# Patient Record
Sex: Female | Born: 1980 | Hispanic: Yes | Smoking: Never smoker
Health system: Southern US, Community
[De-identification: ages and names within clinical notes are randomized; demographics above are authoritative.]

## PROBLEM LIST (undated history)

## (undated) ENCOUNTER — Inpatient Hospital Stay (HOSPITAL_COMMUNITY): Payer: Self-pay

## (undated) DIAGNOSIS — Z8742 Personal history of other diseases of the female genital tract: Secondary | ICD-10-CM

## (undated) DIAGNOSIS — F32A Depression, unspecified: Secondary | ICD-10-CM

## (undated) DIAGNOSIS — G43909 Migraine, unspecified, not intractable, without status migrainosus: Secondary | ICD-10-CM

## (undated) DIAGNOSIS — F329 Major depressive disorder, single episode, unspecified: Secondary | ICD-10-CM

## (undated) DIAGNOSIS — N888 Other specified noninflammatory disorders of cervix uteri: Secondary | ICD-10-CM

## (undated) HISTORY — PX: CYST EXCISION: SHX5701

---

## 2007-07-19 HISTORY — PX: OVARIAN CYST REMOVAL: SHX89

## 2011-07-19 NOTE — L&D Delivery Note (Signed)
Delivery Note At 9:23 AM a viable female was delivered via Vaginal, Spontaneous Delivery (Presentation: Middle Occiput Anterior).  APGAR: 5, 8; weight 9 lb 7 oz (4280 g).   Placenta status: Intact, Spontaneous.  Cord: 3 vessels. Cord pH: not drawn Baby was delivered through a tight nuchal cord, with tight shoulders released with McRobert's. Of note, pt had brisk bleeding following delivery of placenta with good hemostasis after fundal massage, initiation of Pitocin IV, and Cytotec 800 mcg PR. Pt had a complex sulcus/labial and 2nd degree perineal tear, repaired by Ivonne Andrew, CNM assisted by Dr. Casper Harrison.  Anesthesia: Epidural  Episiotomy: None Lacerations: Sulcus;2nd degree;Perineal Suture Repair: 3.0 vicryl rapide and 3.0 and 4.0 Monocryl Est. Blood Loss (mL): 650  Mom to postpartum.  Baby to nursery-stable.  Ivonne Andrew, CNM was present for and assisted with the delivery in its entirety.  Nova Evett 07/05/2012, 11:30 AM

## 2011-07-19 NOTE — L&D Delivery Note (Signed)
I was present for the delivery and agree with above. Shoulders were OT, snug. Ivonne Andrew, CNM stepped in after Dr. Casper Harrison unable to delivery baby w/ gentle downward traction on head. Delivery achieved by hooking right axilla. Baby delivered by somersaulted maneuver due to tight nuchal.   Dorathy Kinsman, CNM 07/05/2012 1:10 PM

## 2011-10-31 ENCOUNTER — Inpatient Hospital Stay (HOSPITAL_COMMUNITY)
Admission: AD | Admit: 2011-10-31 | Discharge: 2011-10-31 | Disposition: A | Discharge status: Home / Self Care | Payer: Self-pay | Source: Ambulatory Visit | Attending: Obstetrics and Gynecology | Admitting: Obstetrics and Gynecology

## 2011-10-31 ENCOUNTER — Inpatient Hospital Stay (HOSPITAL_COMMUNITY): Payer: Self-pay

## 2011-10-31 ENCOUNTER — Encounter (HOSPITAL_COMMUNITY): Payer: Self-pay | Admitting: *Deleted

## 2011-10-31 DIAGNOSIS — O26899 Other specified pregnancy related conditions, unspecified trimester: Secondary | ICD-10-CM

## 2011-10-31 DIAGNOSIS — O3410 Maternal care for benign tumor of corpus uteri, unspecified trimester: Secondary | ICD-10-CM | POA: Insufficient documentation

## 2011-10-31 DIAGNOSIS — N83209 Unspecified ovarian cyst, unspecified side: Secondary | ICD-10-CM

## 2011-10-31 DIAGNOSIS — R109 Unspecified abdominal pain: Secondary | ICD-10-CM

## 2011-10-31 DIAGNOSIS — D259 Leiomyoma of uterus, unspecified: Secondary | ICD-10-CM | POA: Insufficient documentation

## 2011-10-31 DIAGNOSIS — O34599 Maternal care for other abnormalities of gravid uterus, unspecified trimester: Secondary | ICD-10-CM | POA: Insufficient documentation

## 2011-10-31 DIAGNOSIS — D219 Benign neoplasm of connective and other soft tissue, unspecified: Secondary | ICD-10-CM

## 2011-10-31 DIAGNOSIS — R1032 Left lower quadrant pain: Secondary | ICD-10-CM | POA: Insufficient documentation

## 2011-10-31 DIAGNOSIS — N831 Corpus luteum cyst of ovary, unspecified side: Secondary | ICD-10-CM | POA: Insufficient documentation

## 2011-10-31 HISTORY — DX: Major depressive disorder, single episode, unspecified: F32.9

## 2011-10-31 HISTORY — DX: Depression, unspecified: F32.A

## 2011-10-31 LAB — URINALYSIS, ROUTINE W REFLEX MICROSCOPIC
Bilirubin Urine: NEGATIVE
Glucose, UA: 100 mg/dL — AB
Hgb urine dipstick: NEGATIVE
Specific Gravity, Urine: 1.025 (ref 1.005–1.030)
Urobilinogen, UA: 0.2 mg/dL (ref 0.0–1.0)
pH: 6 (ref 5.0–8.0)

## 2011-10-31 LAB — CBC
HCT: 37.3 % (ref 36.0–46.0)
MCH: 31.6 pg (ref 26.0–34.0)
MCHC: 33.8 g/dL (ref 30.0–36.0)
MCV: 93.5 fL (ref 78.0–100.0)
Platelets: 220 10*3/uL (ref 150–400)
RDW: 13.7 % (ref 11.5–15.5)

## 2011-10-31 LAB — DIFFERENTIAL
Basophils Absolute: 0 10*3/uL (ref 0.0–0.1)
Eosinophils Absolute: 0.1 10*3/uL (ref 0.0–0.7)
Eosinophils Relative: 2 % (ref 0–5)
Lymphocytes Relative: 37 % (ref 12–46)
Monocytes Absolute: 0.3 10*3/uL (ref 0.1–1.0)

## 2011-10-31 LAB — URINE MICROSCOPIC-ADD ON

## 2011-10-31 LAB — HCG, QUANTITATIVE, PREGNANCY: hCG, Beta Chain, Quant, S: 13846 m[IU]/mL — ABNORMAL HIGH (ref <5)

## 2011-10-31 LAB — WET PREP, GENITAL
Trich, Wet Prep: NONE SEEN
Yeast Wet Prep HPF POC: NONE SEEN

## 2011-10-31 MED ORDER — IBUPROFEN 400 MG PO TABS
400.0000 mg | ORAL_TABLET | Freq: Once | ORAL | Status: AC
  Administered 2011-10-31: 400 mg via ORAL
  Filled 2011-10-31: qty 1

## 2011-10-31 NOTE — MAU Note (Signed)
Pt presents for abdominal pain x4days.  Denies any bleeding or vaginal discharge.  Pt states having positive pregnancy test at medical clinic in [facility].

## 2011-10-31 NOTE — Discharge Instructions (Signed)
Dolor abdominal en el embarazo  (Abdominal Pain During Pregnancy) Las molestias abdominales son frecuentes Academic librarian. Generalmente no causan ningn dao. Puede tener numerosas causas. Algunas causas son ms graves que otras. Ciertas causas se diagnostican fcilmente. En algunos casos, se demora algn tiempo para comprender el diagnstico. Otras veces la causa no se conoce. El dolor abdominal puede ser un signo de que algo no anda bien en el [place] puede ser debido a una causa totalmente diferente. Por este motivo, siempre comente a su mdico cuando sienta molestias abdominales.  CAUSAS  Las causas ms frecuentes y que no causan ningn dao son:   Constipacin.   Exceso de gases y meteorismo.   Dolor en el ligamento redondo. Este dolor se siente en los pliegues de la ingle.   La posicin en que se encuentra el beb o la placenta.   Las pataditas del beb.   Contracciones de Braxton-Hicks. Estas son contracciones suaves que no producen dilatacin del cuello.  Otras causas graves de dolor abdominal son:   Vanetta Mulders ectpico Se produce cuando un vulo fertilizado se implanta fuera del tero.   Aborto espontneo.   Parto prematuro. El parto prematuro comienza antes de la semana 37 de [place].   Desprendimiento de la placenta. Ocurre cuando la placenta se separa parcial o completamente del tero.   Preeclampsia Generalmente se asocia a hipertensin arterial y tambin se denomina "toxemia del embarazo".   Infecciones del tero o del lquido amnitico.  Las causas que no se relacionan con Firefighter son:   Infeccin del tracto urinario.   Clculos o inflamacin de la vescula.   Hepatitis u otras enfermedades del hgado.   Trastornos intestinales, virus en el estmago, intoxicacin alimentaria, lcera.   Apendicitis.   Clculos en el rin (renales).   Infeccin renal (pielonefritis).  INSTRUCCIONES PARA EL CUIDADO EN EL HOGAR  Si el dolor es leve:   No tenga  relaciones sexuales y no coloque nada dentro de la vagina hasta que los sntomas hayan desaparecido completamente.   Descanse todo lo que pueda hasta que el dolor haya calmado. Si el dolor no mejora en una hora, comunquese con su mdico.   Si siente nuseas, beba lquidos claros. Evite los alimentos slidos hasta que no sienta Dentist en el estmago o desaparezcan las nuseas.   Tome slo la medicacin que le indic el profesional.   Concurra puntualmente a las citas con el mdico.  SOLICITE ATENCIN MDICA DE INMEDIATO SI:   Tiene un sangrado, prdida de lquidos o lo observa al limpiarse la vagina con tis.   El dolor o los clicos [place].   Tiene vmitos persistentes.   Comienza a Financial risk analyst al orinar u [employer].   Tiene fiebre.   Los movimientos del beb disminuyen.   Nota un debilitamiento extremo o se marea.   Tiene dificultad para respirar con o sin dolor abdominal.   Siente un dolor de cabeza intenso junto al dolor abdominal.   Tiene secrecin vaginal con dolor abdominal.   Tiene diarrea persistente.   El dolor abdominal sigue an despus de Field seismologist.  ASEGRESE DE QUE:   Comprende estas instrucciones.   Controlar su enfermedad.   Solicitar ayuda de inmediato si no mejora o si empeora.  Document Released: 07/04/2005 Document Revised: 06/23/2011 [facility] Patient Information 2012 [place], [place].Fibromas (Fibroids) Los fibromas son bultos (tumores) que pueden Conservation officer, nature del cuerpo de Nurse, mental health. Estos tumores no son cancerosos. Pueden variar en Derrill Kay y  lugar en el que crecen. CUIDADOS EN EL HOGAR  No tome aspirina.   Anote el nmero de apsitos o tampones que [place] durante el perodo. Infrmelo a su mdico. Esto puede ayudar a determinar el mejor tratamiento para usted.  SOLICITE AYUDA DE INMEDIATO SI:  Siente dolor en la zona inferior del vientre (abdomen) y no se alivia con analgsicos.   Tiene clicos que no se  calman con medicamentos   Aumenta el sangrado entre perodos o durante el mismo.   Sufre mareos o se desvanece (se desmaya).   El dolor en el vientre [place].  ASEGRESE DE QUE:  Comprende estas instrucciones.   Controlar su enfermedad.   Solicitar ayuda de inmediato si no mejora o empeora.  Document Released: 10/19/2010 Document Revised: 06/23/2011 [facility] Patient Information 2012 [place], [place].Quiste ovrico (Ovarian Cyst) Los ovarios son pequeos rganos que se encuentran a cada lado del tero. Los ovarios son los rganos que producen las hormonas femeninas, estrgeno y Education officer, museum. Un quiste en el ovario es una bolsa llena de lquido que puede variar en tamao. Es normal que se formen pequeos quistes en las mujeres en edad de procrear y que an tienen sus perodos Designer, jewellery. Este tipo de quiste se denomina quiste folicular que se transforma en un quiste ovulatorio (quiste del cuerpo lteo) despus de producir los vulos. Si la mujer no queda embarazada, desaparece sin ninguna intervencin. Existen otros tipos de quistes de ovario que pueden causar problemas y necesitan ser tratados. El problema ms grave es que el quiste sea canceroso. Debe advertirse que en las mujeres menopusicas que presentan un quiste de ovario, existe un mayor riesgo de que ese quiste sea canceroso. Deben evaluarse muy rpida y [place], y IT sales professional. Esto es ms importante en las mujeres menopusicas debido al elevado porcentaje de cncer de ovario durante este perodo. CAUSAS Y TIPOS DE CNCER DE OVARIO:  QUISTE FUNCIONAL: El quiste de folculo o cuerpo lteo es un quiste funcional que aparece todos los meses durante la ovulacin, con el ciclo menstrual. Si la mujer no queda embarazada, desaparecen con el prximo ciclo menstrual. Generalmente los quistes funcionales no presentan sntomas.   ENDOMETRIOMA: este quiste aparece en la superficie del tejido del tero. Un quiste se forma en el  interior o [employer]. Cada mes se desarrolla un poco ms debido a la sangre del perodo menstrual. Tambin se denomina "quiste de chocolate" debido a que est lleno de sangre que se vuelve color marrn. Este tipo de quiste causa dolor en la zona inferior del abdomen durante las relaciones sexuales y durante el perodo menstrual.   CISTADENOMA: Se desarrolla a partir de las clulas externas del ovario. Generalmente no son cancerosos. Pueden llegar a ser de gran tamao y causar dolor en la zona baja del abdomen y [employer] sexuales. Este tipo de quiste puede retorcerse e interrumpir el flujo de [place], lo que causa un dolor muy intenso. Tambin puede romperse y Horticulturist, commercial.   QUISTE DERMOIDE: generalmente este tipo de quiste aparece en ambos ovarios. Puede haber diferentes tipos de tejidos en el quiste. Por ejemplo tejidos de piel, dientes, pelos o cartlago. En general no dan sntomas, excepto que sean muy grandes. Los quistes dermoides rara vez son cancerosos.   OVARIO POLIQUSTICO: es una enfermedad rara relacionada con trastornos hormonales que produce muchos quistes pequeos en ambos ovarios. Estos quistes son similares a los quistes de folculo pero nunca producen vulos y se transforman en cuerpo lteo. Pueden causar aumento del  peso corporal, infertilidad, acn, aumento del vello facial y corporal y falta de perodos menstruales o perodos anormales. Muchas mujeres que sufren este problema presentan diabetes tipo 2. La causa exacta de este problema es desconocida. Un ovario poliqustico rara vez es canceroso.   QUISTE OVRICO TECALUTESTICO Aparece cuando hay demasiada hormona (gonadotrofina corinica humana), la que sobreestimula al ovario para producir vulos. Se observan con frecuencia cuando el mdico estimula los ovarios para la fertilizacin in vitro (bebs de probeta).   QUISTE LUTENICO: Aparece durante el embarazo. En algunos casos raros, produce una obstruccin del  canal de parto. Generalmente desaparece despus del parto.  SNTOMAS  Dolor o molestias en la pelvis.   Dolor durante las [employer].   Aumento de la inflamacin en el abdomen.   Perodos menstruales anormales.   Aumento del [employer] perodos [place].   Deja de menstruar y no est embarazada.  DIAGNSTICO El diagnstico puede realizarse durante:  Los exmenes plvicos anuales o de rutina (frecuente).   Ecografas   Radiografas de la pelvis.   Tomografa computada   Resonancia magntica..   Anlisis de sangre.  TRATAMIENTO  El tratamiento slo consiste en que el mdico controle el quiste [place], durante 2  3 meses. Muchos desaparecen espontnemente, especialmente los quistes funcionales.   Puede aspirarse (secarse) con Marella Bile larga observndolo en una ecografa, o por laparoscopa (insertando un tubo en la pelvis a travs de una pequea incisin).   El quiste puede extirparse con laparoscopa.   En algunos casos es necesario extirparlo a travs de una incisin en la zona inferior del abdomen.   El tratamiento hormonal se utiliza para Restaurant manager, fast food ciertos tipos de [place].   Las pldoras anticonceptivas pueden utilizarse para Restaurant manager, fast food otros tipos.  INSTRUCCIONES PARA EL CUIDADO DOMICILIARIO Siga las indicaciones del profesional con respecto a:  Medicamentos   Visitas de control para evaluar y Pharmacologist.   Puede ser necesario que tenga que volver o concertar una cita con otro profesional para descubrir la causa exacta del quiste, si su mdico no es Research scientist (physical sciences).   Realice un examen plvico y un Papanicolau todos los aos, segn las indicaciones.   Informe al mdico si tubo un quiste de ovario en el pasado.  SOLICITE ATENCIN MDICA SI:  Los perodos se atrasan, son irregulares, le faltan o son dolorosos.   El dolor abdominal (en el vientre) o en la pelvis persisten.   El abdomen se agranda o se hincha.   Siente una opresin en la  vejiga o tiene problemas para vaciarla completamente.   Tiene dolor durante las [employer].   Tiene la sensacin de hinchazn, presin o molestias en el abdomen.   Pierde peso sin razn aparente.   Siente un Engineer, maintenance (IT).   Est constipada.   Pierde el apetito.   Aparece acn.   Aumenta el vello facial y Personal assistant.   Lenora Boys de peso sin hacer modificaciones en su actividad fsica y en su dieta habitual.   Sospecha que est embarazada.  SOLICITE ATENCIN MDICA DE INMEDIATO SI:  Siente dolor abdominal cada vez ms intenso.   Si tiene ganas de vomitar (nuseas).   Le sube repentinamente la fiebre.   Siente dolor abdominal al mover el intestino.   Sus perodos menstruales son ms abundantes que lo habitual.  Document Released: 04/13/2005 Document Revised: 06/23/2011 [facility] Patient Information 2012 [place], [place].

## 2011-10-31 NOTE — Progress Notes (Signed)
[facility] Department of Clinical Social Work Documentation of Interpretation   I assisted __Brandy & Judy RN'S_________________ with interpretation of __questions____________________ for this patient.

## 2011-10-31 NOTE — MAU Provider Note (Signed)
Agree with above note.  Kendal Raffo 10/31/2011 1:34 PM   

## 2011-10-31 NOTE — MAU Provider Note (Signed)
History     CSN: [id number]  Arrival date & time 10/31/11  0740   None     Chief Complaint  Patient presents with  . Abdominal Pain    HPI Caitlin Bailey is a 31 y.o. female @ [redacted]w[redacted]d gestation who presents to MAU for abdominal pain. The pain started 4 days ago and has gotten worse. She describes the pain as sharp and comes and goes. The pain starts in the LLQ of the abdomen and radiates to the left side of her back. She rates the pain as 8/10. The history was provided by the patient using Eda Royal the [place] translator.  Past Medical History  Diagnosis Date  . Depression     took meds after mother's passing    Past Surgical History  Procedure Date  . Ovarian cyst removal 2009    Family History  Problem Relation Age of Onset  . Heart disease Mother   . Diabetes Father   . Cancer Paternal Aunt     History  Substance Use Topics  . Smoking status: Never Smoker   . Smokeless tobacco: Not on file  . Alcohol Use: No    OB History    Grav Para Term Preterm Abortions TAB SAB Ect Mult Living   1               Review of Systems  Constitutional: Negative for fever, chills, diaphoresis and fatigue.  HENT: Negative for ear pain, congestion, sore throat, facial swelling, neck pain, neck stiffness, dental problem and sinus pressure.   Eyes: Negative for photophobia, pain and discharge.  Respiratory: Negative for cough, chest tightness and wheezing.   Gastrointestinal: Positive for nausea and abdominal pain. Negative for vomiting, diarrhea, constipation and abdominal distention.  Genitourinary: Negative for dysuria, frequency, flank pain, vaginal bleeding, vaginal discharge, difficulty urinating and vaginal pain.  Musculoskeletal: Positive for back pain. Negative for myalgias and gait problem.  Skin: Negative for color change and rash.  Neurological: Negative for dizziness, speech difficulty, weakness, light-headedness, numbness and headaches.  Psychiatric/Behavioral:  Negative for confusion and agitation. The patient is not nervous/anxious.     Allergies  Review of patient's allergies indicates no known allergies.  Home Medications  No current outpatient prescriptions on file.  BP 110/64  Pulse 79  Temp(Src) 98.8 F (37.1 C) (Oral)  Resp 18  Ht 5\' 1"  (1.549 m)  Wt 120 lb 12.8 oz (54.795 kg)  BMI 22.83 kg/m2  LMP 09/18/2011  Physical Exam  Nursing note and vitals reviewed. Constitutional: She is oriented to person, place, and time. She appears well-developed and well-nourished. No distress.  HENT:  Head: Normocephalic.  Eyes: EOM are normal.  Neck: Neck supple.  Cardiovascular: Normal rate.   Pulmonary/Chest: Effort normal.  Abdominal: Soft. There is no tenderness.  Genitourinary:       External genitalia without lesions. Mucous discharge vaginal vault. Cervix closed, no CMT, mild left adnexal tenderness. Uterus slightly enlarged.  Musculoskeletal: Normal range of motion.  Neurological: She is alert and oriented to person, place, and time. No cranial nerve deficit.  Skin: Skin is warm and dry.  Psychiatric: She has a normal mood and affect. Her behavior is normal. Judgment and thought content normal.   Results for orders placed during the hospital encounter of 10/31/11 (from the past 24 hour(s))  POCT PREGNANCY, URINE     Status: Abnormal   Collection Time   10/31/11  7:56 AM      Component Value Range  Preg Test, Ur POSITIVE (*) NEGATIVE   URINALYSIS, ROUTINE W REFLEX MICROSCOPIC     Status: Abnormal   Collection Time   10/31/11  8:25 AM      Component Value Range   Color, Urine YELLOW  YELLOW    APPearance CLEAR  CLEAR    Specific Gravity, Urine 1.025  1.005 - 1.030    pH 6.0  5.0 - 8.0    Glucose, UA 100 (*) NEGATIVE (mg/dL)   Hgb urine dipstick NEGATIVE  NEGATIVE    Bilirubin Urine NEGATIVE  NEGATIVE    Ketones, ur NEGATIVE  NEGATIVE (mg/dL)   Protein, ur NEGATIVE  NEGATIVE (mg/dL)   Urobilinogen, UA 0.2  0.0 - 1.0 (mg/dL)     Nitrite NEGATIVE  NEGATIVE    Leukocytes, UA SMALL (*) NEGATIVE   URINE MICROSCOPIC-ADD ON     Status: Normal   Collection Time   10/31/11  8:25 AM      Component Value Range   Squamous Epithelial / LPF RARE  RARE    WBC, UA 0-2  <3 (WBC/hpf)  CBC     Status: Normal   Collection Time   10/31/11  9:29 AM      Component Value Range   WBC 6.1  4.0 - 10.5 (K/uL)   RBC 3.99  3.87 - 5.11 (MIL/uL)   Hemoglobin 12.6  12.0 - 15.0 (g/dL)   HCT 57.8  46.9 - 62.9 (%)   MCV 93.5  78.0 - 100.0 (fL)   MCH 31.6  26.0 - 34.0 (pg)   MCHC 33.8  30.0 - 36.0 (g/dL)   RDW 52.8  41.3 - 24.4 (%)   Platelets 220  150 - 400 (K/uL)  DIFFERENTIAL     Status: Normal   Collection Time   10/31/11  9:29 AM      Component Value Range   Neutrophils Relative 55  43 - 77 (%)   Neutro Abs 3.3  1.7 - 7.7 (K/uL)   Lymphocytes Relative 37  12 - 46 (%)   Lymphs Abs 2.2  0.7 - 4.0 (K/uL)   Monocytes Relative 6  3 - 12 (%)   Monocytes Absolute 0.3  0.1 - 1.0 (K/uL)   Eosinophils Relative 2  0 - 5 (%)   Eosinophils Absolute 0.1  0.0 - 0.7 (K/uL)   Basophils Relative 1  0 - 1 (%)   Basophils Absolute 0.0  0.0 - 0.1 (K/uL)  HCG, QUANTITATIVE, PREGNANCY     Status: Abnormal   Collection Time   10/31/11  9:29 AM      Component Value Range   hCG, Beta Chain, Quant, S 13846 (*) <5 (mIU/mL)  ABO/RH     Status: Normal   Collection Time   10/31/11  9:29 AM      Component Value Range   ABO/RH(D) O POS     Ultrasound shows a 5.5 week IUGS but no YS or FP with 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  ED Course  Procedures   MDM

## 2011-11-01 LAB — GC/CHLAMYDIA PROBE AMP, GENITAL
Chlamydia, DNA Probe: NEGATIVE
GC Probe Amp, Genital: NEGATIVE

## 2011-11-10 ENCOUNTER — Ambulatory Visit (HOSPITAL_COMMUNITY)
Admit: 2011-11-10 | Discharge: 2011-11-10 | Disposition: A | Discharge status: Home / Self Care | Payer: Self-pay | Attending: Obstetrics and Gynecology | Admitting: Obstetrics and Gynecology

## 2011-11-10 ENCOUNTER — Encounter (HOSPITAL_COMMUNITY): Payer: Self-pay | Admitting: *Deleted

## 2011-11-10 ENCOUNTER — Inpatient Hospital Stay (HOSPITAL_COMMUNITY)
Admission: AD | Admit: 2011-11-10 | Discharge: 2011-11-10 | Disposition: A | Discharge status: Home / Self Care | Payer: Self-pay | Source: Ambulatory Visit | Attending: Obstetrics & Gynecology | Admitting: Obstetrics & Gynecology

## 2011-11-10 DIAGNOSIS — O210 Mild hyperemesis gravidarum: Secondary | ICD-10-CM | POA: Insufficient documentation

## 2011-11-10 DIAGNOSIS — R1032 Left lower quadrant pain: Secondary | ICD-10-CM | POA: Insufficient documentation

## 2011-11-10 DIAGNOSIS — R5381 Other malaise: Secondary | ICD-10-CM | POA: Insufficient documentation

## 2011-11-10 DIAGNOSIS — R5383 Other fatigue: Secondary | ICD-10-CM | POA: Insufficient documentation

## 2011-11-10 DIAGNOSIS — O99891 Other specified diseases and conditions complicating pregnancy: Secondary | ICD-10-CM | POA: Insufficient documentation

## 2011-11-10 DIAGNOSIS — Z09 Encounter for follow-up examination after completed treatment for conditions other than malignant neoplasm: Secondary | ICD-10-CM

## 2011-11-10 DIAGNOSIS — R51 Headache: Secondary | ICD-10-CM | POA: Insufficient documentation

## 2011-11-10 DIAGNOSIS — O3680X0 Pregnancy with inconclusive fetal viability, not applicable or unspecified: Secondary | ICD-10-CM | POA: Insufficient documentation

## 2011-11-10 NOTE — MAU Provider Note (Signed)
Attestation of Attending Supervision of Advanced Practitioner: Evaluation and management procedures were performed by the OB Fellow/PA/CNM/NP under my supervision and collaboration. Chart reviewed, and agree with management and plan.  Bruin Bolger, M.D. 11/10/2011 2:39 PM   

## 2011-11-10 NOTE — MAU Note (Signed)
Pt states per [facility] interpreter she feels nauseated, weak, feels different. Hx migraines, is concerned that her headaches will get worse. Denies bleeding or abnormal vaginal discharge. Still has same suprapubic pain x3 weeks, has not worsened.

## 2011-11-10 NOTE — Discharge Instructions (Signed)
    ________________________________________     To schedule your Maternity Eligibility Appointment, please call [phone].  When you arrive for your appointment you must bring the following items or information listed below.  Your appointment will be rescheduled if you do not have these items or are 15 minutes late. If currently receiving Medicaid, you MUST bring: 1. Medicaid Card 2. Social Security Card 3. Picture ID 4. Proof of Pregnancy 5. Verification of current address if the address on Medicaid card is incorrect "postmarked mail" If not receiving Medicaid, you MUST bring: 1. Social Security Card 2. Picture ID 3. Birth Certificate (if available) Passport or *Green Card 4. Proof of Pregnancy 5. Verification of current address "postmarked mail" for each income presented. 6. Verification of insurance coverage, if any 7. Check stubs from each employer for the previous month (if unable to present check stub  for each week, we will accept check stub for the first and last week ill the same month.) If you can't locate check stubs, you must bring a letter from the employer(s) and it must have the following information on letterhead, typed, in English: o name of company o company telephone number o how long been with the company, if less than one month o how much person earns per hour o how many hours per week work o the gross pay the person earned for the previous month If you are 29 years old or less, you do not have to bring proof of income unless you work or live with the father of the baby and at that time we will need proof of income from you and/or the father of the baby. Green Card recipients are eligible for Medicaid for Pregnant Women (MPW)    

## 2011-11-10 NOTE — MAU Provider Note (Signed)
History     CSN: [id number]  Arrival date & time 11/10/11  1105   None     No chief complaint on file.   HPI Caitlin Bailey is a 31 y.o. female who returns to MAU for follow up ultrasound. She denies any problems today.  Past Medical History  Diagnosis Date  . Depression     took meds after mother's passing    Past Surgical History  Procedure Date  . Ovarian cyst removal 2009    Family History  Problem Relation Age of Onset  . Heart disease Mother   . Diabetes Father   . Cancer Paternal Aunt     History  Substance Use Topics  . Smoking status: Never Smoker   . Smokeless tobacco: Not on file  . Alcohol Use: No    OB History    Grav Para Term Preterm Abortions TAB SAB Ect Mult Living   1 0 0 0 0 0 0 0 0 0       Review of Systems  Gastrointestinal: Abdominal pain: occasional cramping.  Genitourinary: Positive for frequency.  All other systems reviewed and are negative.    Allergies  Review of patient's allergies indicates no known allergies.  Home Medications  No current outpatient prescriptions on file.  BP 109/58  Pulse 72  Temp(Src) 98.2 F (36.8 C) (Oral)  Resp 16  Ht 5\' 1"  (1.549 m)  Wt 118 lb 6 oz (53.695 kg)  BMI 22.37 kg/m2  LMP 09/18/2011  Physical Exam  Nursing note and vitals reviewed. Constitutional: She appears well-developed and well-nourished. No distress.  HENT:  Head: Normocephalic.  Cardiovascular: Normal rate.   Pulmonary/Chest: Effort normal.  Psychiatric: She has a normal mood and affect. Her behavior is normal. Judgment and thought content normal.   Instructions and results given to patient using the hospital Spanish translator Estate manager/land agent).  Assessment: Follow up   Viable IUP at 6 weeks and 1 day  Plan:  Start prenatal care   Pregnancy verification letter given.   Return as needed.    I have reviewed this patient's vital signs, nurses notes, appropriate labs and imaging.      ED Course  Procedures       MDM

## 2012-01-07 ENCOUNTER — Encounter (HOSPITAL_COMMUNITY): Payer: Self-pay

## 2012-01-07 ENCOUNTER — Inpatient Hospital Stay (HOSPITAL_COMMUNITY)
Admission: AD | Admit: 2012-01-07 | Discharge: 2012-01-07 | Disposition: A | Discharge status: Home / Self Care | Payer: Self-pay | Source: Ambulatory Visit | Attending: Family Medicine | Admitting: Family Medicine

## 2012-01-07 DIAGNOSIS — D259 Leiomyoma of uterus, unspecified: Secondary | ICD-10-CM

## 2012-01-07 DIAGNOSIS — O3410 Maternal care for benign tumor of corpus uteri, unspecified trimester: Secondary | ICD-10-CM | POA: Insufficient documentation

## 2012-01-07 DIAGNOSIS — O210 Mild hyperemesis gravidarum: Secondary | ICD-10-CM | POA: Insufficient documentation

## 2012-01-07 DIAGNOSIS — R109 Unspecified abdominal pain: Secondary | ICD-10-CM | POA: Insufficient documentation

## 2012-01-07 LAB — CBC
Hemoglobin: 12.3 g/dL (ref 12.0–15.0)
MCH: 31.4 pg (ref 26.0–34.0)
MCHC: 34.1 g/dL (ref 30.0–36.0)
MCV: 92.1 fL (ref 78.0–100.0)
Platelets: 236 10*3/uL (ref 150–400)
RBC: 3.92 MIL/uL (ref 3.87–5.11)

## 2012-01-07 MED ORDER — IBUPROFEN 600 MG PO TABS
600.0000 mg | ORAL_TABLET | Freq: Once | ORAL | Status: AC
  Administered 2012-01-07: 600 mg via ORAL
  Filled 2012-01-07: qty 1

## 2012-01-07 MED ORDER — IBUPROFEN 600 MG PO TABS: 600.0000 mg | ORAL_TABLET | Freq: Four times a day (QID) | ORAL | Status: AC | PRN

## 2012-01-07 MED ORDER — PROMETHAZINE HCL 25 MG PO TABS: 25.0000 mg | ORAL_TABLET | Freq: Four times a day (QID) | ORAL | Status: DC | PRN

## 2012-01-07 NOTE — MAU Provider Note (Signed)
Chart reviewed and agree with management and plan.  

## 2012-01-07 NOTE — MAU Note (Signed)
Onset of mid abdominal pain onset around 5:00 no N/V/D, having some constipation, LBM today normal, no vaginal bleeding, no vaginal discharge.

## 2012-01-07 NOTE — Discharge Instructions (Signed)
Rx ibuprofen 600 gm PO q 6 hours x 48 hours only - do not take any ibuprofen after 48 hours,. Take Tylenol 325 mg 2 tablets by mouth every 4 hours if needed for pain. Begin prenatal care. Return if severe pain or severe bleeding. Rx phenergan 25 mg one po q 6 hours as needed for nausea.  Sedation precautions. Drink at least 8 8-oz glasses of water every day.

## 2012-01-07 NOTE — MAU Provider Note (Signed)
History     CSN: [id number]  Arrival date and time: 01/07/12 4098   First Provider Initiated Contact with Patient 01/07/12 [phone]      Chief Complaint  Patient presents with  . Abdominal Pain   HPI Caitlin Bailey 31 y.o. [redacted]w[redacted]d Comes to MAU with abdominal pain above and below the umbilicus.  Language line used for client interview.  Has not yet started prenatal care.  Has been seen in MAU previously.  Has known fibroids on ultrasound.  Has had increased abdominal pain since Thursday.  Takes 1/2 of an extra strength Tylenol for pain.  Has had some vomiting.  Last vomited this morning.  No vaginal bleeding.  No dysuria.    OB History    Grav Para Term Preterm Abortions TAB SAB Ect Mult Living   1 0 0 0 0 0 0 0 0 0       Past Medical History  Diagnosis Date  . Depression     took meds after mother's passing    Past Surgical History  Procedure Date  . Ovarian cyst removal 2009    Family History  Problem Relation Age of Onset  . Heart disease Mother   . Diabetes Father   . Cancer Paternal Aunt     History  Substance Use Topics  . Smoking status: Never Smoker   . Smokeless tobacco: Not on file  . Alcohol Use: No    Allergies: No Known Allergies  Prescriptions prior to admission  Medication Sig Dispense Refill  . acetaminophen (TYLENOL) 500 MG tablet Take 250 mg by mouth daily as needed. For pain      . Prenatal Vit-Fe Fumarate-FA (PRENATAL MULTIVITAMIN) TABS Take 1 tablet by mouth daily.        Review of Systems  Constitutional: Negative for fever.  Gastrointestinal: Positive for nausea, vomiting and abdominal pain.  Genitourinary: Negative for dysuria.       No vaginal discharge. No vaginal bleeding. No dysuria.   Physical Exam   Blood pressure 111/64, pulse 80, temperature 98.6 F (37 C), temperature source Oral, resp. rate 18, height 5\' 1"  (1.549 m), weight 121 lb (54.885 kg), last menstrual period 09/18/2011.  Physical Exam  Nursing note and vitals  reviewed. Constitutional: She is oriented to person, place, and time. She appears well-developed and well-nourished.  HENT:  Head: Normocephalic.  Eyes: EOM are normal.  Neck: Neck supple.  GI: Soft. There is tenderness. There is no rebound and no guarding.       Tender above and below the umbilicus.  Fundus palpated at the umbilicus.  FHT heard at 140 by doppler.  Genitourinary:       Cervix - long closed thick  Musculoskeletal: Normal range of motion.  Neurological: She is alert and oriented to person, place, and time.  Skin: Skin is warm and dry.  Psychiatric: She has a normal mood and affect.    MAU Course  Procedures Reviewed previous notes and ultrasound for client. Fibroids noted on ultrasound done at 6 weeks. Consult with Dr. Shawnie Pons re: plan of care  MDM Results for orders placed during the hospital encounter of 01/07/12 (from the past 24 hour(s))  CBC     Status: Abnormal   Collection Time   01/07/12  9:14 AM      Component Value Range   WBC 11.7 (*) 4.0 - 10.5 K/uL   RBC 3.92  3.87 - 5.11 MIL/uL   Hemoglobin 12.3  12.0 - 15.0 g/dL   HCT  36.1  36.0 - 46.0 %   MCV 92.1  78.0 - 100.0 fL   MCH 31.4  26.0 - 34.0 pg   MCHC 34.1  30.0 - 36.0 g/dL   RDW 08.6  57.8 - 46.9 %   Platelets 236  150 - 400 K/uL    Assessment and Plan  Vomiting in pregnancy Abdominal pain in pregnancy - likely due to fibroids  Plan Rx ibuprofen 600 gm PO q 6 hours x 48 hours only - do not take any ibuprofen after 48 hours,. Take Tylenol 325 mg 2 tablets by mouth every 4 hours if needed for pain. Begin prenatal care. Return if severe pain or severe bleeding. Rx phenergan 25 mg one po q 6 hours as needed for nausea.  Sedation precautions.   Caitlin Bailey 01/07/2012, 9:58 AM

## 2012-02-22 ENCOUNTER — Other Ambulatory Visit (HOSPITAL_COMMUNITY): Payer: Self-pay | Admitting: Physician Assistant

## 2012-02-22 DIAGNOSIS — Z3689 Encounter for other specified antenatal screening: Secondary | ICD-10-CM

## 2012-02-22 DIAGNOSIS — Z0389 Encounter for observation for other suspected diseases and conditions ruled out: Secondary | ICD-10-CM

## 2012-02-22 LAB — OB RESULTS CONSOLE HEPATITIS B SURFACE ANTIGEN: Hepatitis B Surface Ag: NEGATIVE

## 2012-02-22 LAB — OB RESULTS CONSOLE RPR: RPR: NONREACTIVE

## 2012-02-22 LAB — OB RESULTS CONSOLE RUBELLA ANTIBODY, IGM: Rubella: IMMUNE

## 2012-02-27 ENCOUNTER — Ambulatory Visit (HOSPITAL_COMMUNITY)
Admission: RE | Admit: 2012-02-27 | Discharge: 2012-02-27 | Disposition: A | Discharge status: Home / Self Care | Payer: Medicaid Other | Source: Ambulatory Visit | Attending: Physician Assistant | Admitting: Physician Assistant

## 2012-02-27 DIAGNOSIS — O358XX0 Maternal care for other (suspected) fetal abnormality and damage, not applicable or unspecified: Secondary | ICD-10-CM | POA: Insufficient documentation

## 2012-02-27 DIAGNOSIS — Z1389 Encounter for screening for other disorder: Secondary | ICD-10-CM | POA: Insufficient documentation

## 2012-02-27 DIAGNOSIS — Z363 Encounter for antenatal screening for malformations: Secondary | ICD-10-CM | POA: Insufficient documentation

## 2012-02-27 DIAGNOSIS — Z3689 Encounter for other specified antenatal screening: Secondary | ICD-10-CM

## 2012-03-15 ENCOUNTER — Other Ambulatory Visit (HOSPITAL_COMMUNITY): Payer: Self-pay | Admitting: Family

## 2012-03-15 DIAGNOSIS — Z1389 Encounter for screening for other disorder: Secondary | ICD-10-CM

## 2012-03-21 ENCOUNTER — Ambulatory Visit (HOSPITAL_COMMUNITY): Payer: Self-pay

## 2012-04-10 ENCOUNTER — Ambulatory Visit (HOSPITAL_COMMUNITY)
Admission: RE | Admit: 2012-04-10 | Discharge: 2012-04-10 | Disposition: A | Discharge status: Home / Self Care | Payer: Medicaid Other | Source: Ambulatory Visit | Attending: Family | Admitting: Family

## 2012-04-10 DIAGNOSIS — Z3689 Encounter for other specified antenatal screening: Secondary | ICD-10-CM | POA: Insufficient documentation

## 2012-04-10 DIAGNOSIS — Z1389 Encounter for screening for other disorder: Secondary | ICD-10-CM

## 2012-04-10 DIAGNOSIS — O358XX0 Maternal care for other (suspected) fetal abnormality and damage, not applicable or unspecified: Secondary | ICD-10-CM | POA: Insufficient documentation

## 2012-04-26 ENCOUNTER — Other Ambulatory Visit (HOSPITAL_COMMUNITY): Payer: Self-pay | Admitting: Nurse Practitioner

## 2012-04-26 DIAGNOSIS — O358XX0 Maternal care for other (suspected) fetal abnormality and damage, not applicable or unspecified: Secondary | ICD-10-CM

## 2012-05-24 ENCOUNTER — Ambulatory Visit (HOSPITAL_COMMUNITY)
Admission: RE | Admit: 2012-05-24 | Discharge: 2012-05-24 | Disposition: A | Discharge status: Home / Self Care | Payer: Medicaid Other | Source: Ambulatory Visit | Attending: Nurse Practitioner | Admitting: Nurse Practitioner

## 2012-05-24 DIAGNOSIS — O358XX0 Maternal care for other (suspected) fetal abnormality and damage, not applicable or unspecified: Secondary | ICD-10-CM | POA: Insufficient documentation

## 2012-06-08 LAB — OB RESULTS CONSOLE GBS: GBS: NEGATIVE

## 2012-06-18 ENCOUNTER — Inpatient Hospital Stay (HOSPITAL_COMMUNITY)
Admission: AD | Admit: 2012-06-18 | Discharge: 2012-06-18 | Disposition: A | Discharge status: Hospice | Payer: Medicaid Other | Source: Ambulatory Visit | Attending: Obstetrics & Gynecology | Admitting: Obstetrics & Gynecology

## 2012-06-18 ENCOUNTER — Encounter (HOSPITAL_COMMUNITY): Payer: Self-pay | Admitting: *Deleted

## 2012-06-18 DIAGNOSIS — M545 Low back pain, unspecified: Secondary | ICD-10-CM | POA: Insufficient documentation

## 2012-06-18 DIAGNOSIS — R109 Unspecified abdominal pain: Secondary | ICD-10-CM | POA: Insufficient documentation

## 2012-06-18 DIAGNOSIS — O99891 Other specified diseases and conditions complicating pregnancy: Secondary | ICD-10-CM | POA: Insufficient documentation

## 2012-06-18 MED ORDER — HYDROXYZINE HCL 50 MG PO TABS
50.0000 mg | ORAL_TABLET | Freq: Once | ORAL | Status: AC
  Administered 2012-06-18: 50 mg via ORAL
  Filled 2012-06-18: qty 1

## 2012-06-18 MED ORDER — HYDROXYZINE HCL 50 MG/ML IM SOLN: 50.0000 mg | Freq: Once | INTRAMUSCULAR | Status: DC

## 2012-06-18 MED ORDER — NALBUPHINE HCL 10 MG/ML IJ SOLN
10.0000 mg | Freq: Once | INTRAMUSCULAR | Status: AC
  Administered 2012-06-18: 10 mg via INTRAMUSCULAR
  Filled 2012-06-18: qty 1

## 2012-06-18 NOTE — MAU Note (Signed)
Pt reports lower back ache x 4 hours, now having abd pain. Really close together. Denies bleeding or leaking fluid.

## 2012-07-03 ENCOUNTER — Encounter (HOSPITAL_COMMUNITY): Payer: Self-pay | Admitting: *Deleted

## 2012-07-03 ENCOUNTER — Inpatient Hospital Stay (HOSPITAL_COMMUNITY)
Admission: AD | Admit: 2012-07-03 | Discharge: 2012-07-03 | Disposition: A | Discharge status: Home / Self Care | Payer: Medicaid Other | Source: Ambulatory Visit | Attending: Obstetrics and Gynecology | Admitting: Obstetrics and Gynecology

## 2012-07-03 DIAGNOSIS — O479 False labor, unspecified: Secondary | ICD-10-CM | POA: Insufficient documentation

## 2012-07-03 NOTE — MAU Note (Signed)
Light bleeding since 1000, having mild uc's.  Severe vaginal pain & back pain.  Active FM.  Denies LOF.

## 2012-07-04 ENCOUNTER — Inpatient Hospital Stay (HOSPITAL_COMMUNITY): Payer: Medicaid Other | Admitting: Anesthesiology

## 2012-07-04 ENCOUNTER — Encounter (HOSPITAL_COMMUNITY): Payer: Self-pay | Admitting: *Deleted

## 2012-07-04 ENCOUNTER — Encounter (HOSPITAL_COMMUNITY): Payer: Self-pay | Admitting: Anesthesiology

## 2012-07-04 ENCOUNTER — Inpatient Hospital Stay (HOSPITAL_COMMUNITY)
Admission: AD | Admit: 2012-07-04 | Discharge: 2012-07-07 | DRG: 775 | Disposition: A | Discharge status: Home / Self Care | Payer: Medicaid Other | Source: Ambulatory Visit | Attending: Obstetrics & Gynecology | Admitting: Obstetrics & Gynecology

## 2012-07-04 DIAGNOSIS — IMO0001 Reserved for inherently not codable concepts without codable children: Secondary | ICD-10-CM

## 2012-07-04 LAB — TYPE AND SCREEN: Antibody Screen: NEGATIVE

## 2012-07-04 LAB — CBC
Hemoglobin: 14.8 g/dL (ref 12.0–15.0)
MCH: 31 pg (ref 26.0–34.0)
MCV: 91.2 fL (ref 78.0–100.0)
Platelets: 245 10*3/uL (ref 150–400)
RBC: 4.78 MIL/uL (ref 3.87–5.11)

## 2012-07-04 MED ORDER — DIPHENHYDRAMINE HCL 50 MG/ML IJ SOLN: 12.5000 mg | INTRAMUSCULAR | Status: DC | PRN

## 2012-07-04 MED ORDER — OXYTOCIN 40 UNITS IN LACTATED RINGERS INFUSION - SIMPLE MED
1.0000 m[IU]/min | INTRAVENOUS | Status: DC
  Administered 2012-07-04: 1 m[IU]/min via INTRAVENOUS
  Filled 2012-07-04: qty 1000

## 2012-07-04 MED ORDER — GENTAMICIN SULFATE 40 MG/ML IJ SOLN
130.0000 mg | Freq: Three times a day (TID) | INTRAVENOUS | Status: DC
  Administered 2012-07-04 – 2012-07-05 (×2): 130 mg via INTRAVENOUS
  Filled 2012-07-04 (×3): qty 3.25

## 2012-07-04 MED ORDER — PHENYLEPHRINE 40 MCG/ML (10ML) SYRINGE FOR IV PUSH (FOR BLOOD PRESSURE SUPPORT)
80.0000 ug | PREFILLED_SYRINGE | INTRAVENOUS | Status: DC | PRN
  Filled 2012-07-04: qty 5

## 2012-07-04 MED ORDER — ONDANSETRON HCL 4 MG/2ML IJ SOLN
4.0000 mg | Freq: Four times a day (QID) | INTRAMUSCULAR | Status: DC | PRN
  Administered 2012-07-05: 4 mg via INTRAVENOUS
  Filled 2012-07-04: qty 2

## 2012-07-04 MED ORDER — LACTATED RINGERS IV SOLN
500.0000 mL | INTRAVENOUS | Status: DC | PRN
  Administered 2012-07-04 – 2012-07-05 (×3): 500 mL via INTRAVENOUS

## 2012-07-04 MED ORDER — LIDOCAINE HCL (PF) 1 % IJ SOLN
INTRAMUSCULAR | Status: DC | PRN
  Administered 2012-07-04 (×2): 4 mL

## 2012-07-04 MED ORDER — FLEET ENEMA 7-19 GM/118ML RE ENEM: 1.0000 | ENEMA | RECTAL | Status: DC | PRN

## 2012-07-04 MED ORDER — OXYCODONE-ACETAMINOPHEN 5-325 MG PO TABS: 1.0000 | ORAL_TABLET | ORAL | Status: DC | PRN

## 2012-07-04 MED ORDER — LACTATED RINGERS IV SOLN: 500.0000 mL | Freq: Once | INTRAVENOUS | Status: DC

## 2012-07-04 MED ORDER — FENTANYL 2.5 MCG/ML BUPIVACAINE 1/10 % EPIDURAL INFUSION (WH - ANES)
INTRAMUSCULAR | Status: DC | PRN
  Administered 2012-07-04: 13 mL/h via EPIDURAL

## 2012-07-04 MED ORDER — OXYTOCIN 40 UNITS IN LACTATED RINGERS INFUSION - SIMPLE MED
62.5000 mL/h | INTRAVENOUS | Status: DC
  Filled 2012-07-04 (×2): qty 1000

## 2012-07-04 MED ORDER — EPHEDRINE 5 MG/ML INJ: 10.0000 mg | INTRAVENOUS | Status: DC | PRN

## 2012-07-04 MED ORDER — LIDOCAINE HCL (PF) 1 % IJ SOLN
30.0000 mL | INTRAMUSCULAR | Status: DC | PRN
  Filled 2012-07-04 (×3): qty 30

## 2012-07-04 MED ORDER — CITRIC ACID-SODIUM CITRATE 334-500 MG/5ML PO SOLN: 30.0000 mL | ORAL | Status: DC | PRN

## 2012-07-04 MED ORDER — IBUPROFEN 600 MG PO TABS: 600.0000 mg | ORAL_TABLET | Freq: Four times a day (QID) | ORAL | Status: DC | PRN

## 2012-07-04 MED ORDER — TERBUTALINE SULFATE 1 MG/ML IJ SOLN: 0.2500 mg | Freq: Once | INTRAMUSCULAR | Status: AC | PRN

## 2012-07-04 MED ORDER — FENTANYL 2.5 MCG/ML BUPIVACAINE 1/10 % EPIDURAL INFUSION (WH - ANES)
14.0000 mL/h | INTRAMUSCULAR | Status: DC
  Administered 2012-07-04 – 2012-07-05 (×2): 14 mL/h via EPIDURAL
  Filled 2012-07-04 (×4): qty 125

## 2012-07-04 MED ORDER — AMPICILLIN SODIUM 1 G IJ SOLR: 1.0000 g | Freq: Four times a day (QID) | INTRAMUSCULAR | Status: DC

## 2012-07-04 MED ORDER — OXYTOCIN BOLUS FROM INFUSION: 500.0000 mL | INTRAVENOUS | Status: DC

## 2012-07-04 MED ORDER — LACTATED RINGERS IV SOLN
INTRAVENOUS | Status: DC
  Administered 2012-07-04 – 2012-07-05 (×3): via INTRAVENOUS

## 2012-07-04 MED ORDER — ACETAMINOPHEN 325 MG PO TABS
650.0000 mg | ORAL_TABLET | ORAL | Status: DC | PRN
  Administered 2012-07-04 – 2012-07-05 (×3): 650 mg via ORAL
  Filled 2012-07-04 (×3): qty 2

## 2012-07-04 MED ORDER — SODIUM CHLORIDE 0.9 % IV SOLN
2.0000 g | Freq: Four times a day (QID) | INTRAVENOUS | Status: DC
  Administered 2012-07-04 – 2012-07-05 (×2): 2 g via INTRAVENOUS
  Filled 2012-07-04 (×4): qty 2000

## 2012-07-04 MED ORDER — EPHEDRINE 5 MG/ML INJ
10.0000 mg | INTRAVENOUS | Status: DC | PRN
  Filled 2012-07-04: qty 4

## 2012-07-04 MED ORDER — PHENYLEPHRINE 40 MCG/ML (10ML) SYRINGE FOR IV PUSH (FOR BLOOD PRESSURE SUPPORT): 80.0000 ug | PREFILLED_SYRINGE | INTRAVENOUS | Status: DC | PRN

## 2012-07-04 NOTE — Plan of Care (Signed)
Problem: Consults Goal: Birthing Suites Patient Information Press F2 to bring up selections list Outcome: Completed/Met Date Met:  07/04/12  Pt 37-[redacted] weeks EGA

## 2012-07-04 NOTE — Anesthesia Preprocedure Evaluation (Signed)
Anesthesia Evaluation  Patient identified by MRN, date of birth, ID band Patient awake    Reviewed: Allergy & Precautions, H&P , Patient's Chart, lab work & pertinent test results  Airway Mallampati: III TM Distance: >3 FB Neck ROM: full    Dental No notable dental hx. (+) Teeth Intact   Pulmonary neg pulmonary ROS,  breath sounds clear to auscultation  Pulmonary exam normal       Cardiovascular negative cardio ROS  Rhythm:regular Rate:Normal     Neuro/Psych  Headaches, PSYCHIATRIC DISORDERS Depression    GI/Hepatic negative GI ROS, Neg liver ROS,   Endo/Other  negative endocrine ROS  Renal/GU negative Renal ROS  negative genitourinary   Musculoskeletal   Abdominal Normal abdominal exam  (+)   Peds  Hematology negative hematology ROS (+)   Anesthesia Other Findings   Reproductive/Obstetrics (+) Pregnancy                           Anesthesia Physical Anesthesia Plan  ASA: II  Anesthesia Plan: Epidural   Post-op Pain Management:    Induction:   Airway Management Planned:   Additional Equipment:   Intra-op Plan:   Post-operative Plan:   Informed Consent: I have reviewed the patients History and Physical, chart, labs and discussed the procedure including the risks, benefits and alternatives for the proposed anesthesia with the patient or authorized representative who has indicated his/her understanding and acceptance.     Plan Discussed with: Anesthesiologist  Anesthesia Plan Comments:         Anesthesia Quick Evaluation

## 2012-07-04 NOTE — Progress Notes (Signed)
Caitlin Bailey is a 31 y.o. G1P0000 at [redacted]w[redacted]d  Subjective: Comfortable with epidural. Some headache that comes and goes, but no blurriness of vision.  Objective: BP 115/77  Pulse 83  Temp 98.3 F (36.8 C) (Oral)  Resp 20  Ht 5\' 1"  (1.549 m)  Wt 66.225 kg (146 lb)  BMI 27.59 kg/m2  SpO2 99%  LMP 09/18/2011   Total I/O In: -  Out: 250 [Urine:250]  FHT:  FHR: 120-130 bpm, variability: moderate,  accelerations:  Present,  decelerations:  Present few variables, fewer than earlier UC:   regular, every 3 minutes SVE:   Dilation: 6 Effacement (%): 90 Station: -1 Exam by:: Dr Casper Harrison  Labs: Lab Results  Component Value Date   WBC 11.8* 07/04/2012   HGB 14.8 07/04/2012   HCT 43.6 07/04/2012   MCV 91.2 07/04/2012   PLT 245 07/04/2012    Assessment / Plan: Spontaneous labor, progressing slowly Some subjective headache but no vision changes, BP's not elevated  Labor: Little change in last few hours. Will start Pitocin, 1x1. Preeclampsia:  Will monitor subjective complaints and watch pressures Fetal Wellbeing:  Category I Pain Control:  Epidural I/D:  n/a Anticipated MOD:  NSVD  Caitlin Bailey 07/04/2012, 4:05 PM

## 2012-07-04 NOTE — Progress Notes (Signed)
Patient ID: Caitlin Bailey, female   DOB: 1981/05/25, 31 y.o.   MRN: [id number]  FHR stable with occasional variable decels which end with contractions.  UCs hypotonic, only totalling 75 MVU/10 minutes  Pitocin on at 67mu/min.  Will have RN continue to go up on Pitocin.

## 2012-07-04 NOTE — H&P (Signed)
Caitlin Bailey is a 31 y.o. female G1P0000 at [redacted]w[redacted]d by 1st trimester [place], presenting with contractions. Spanish-speaking only, interpreter services utilized. Pt states contractions started around 0400 and have gotten much stronger and closer together, subjectively ~5 minutes apart. Some abdominal tenderness on her sides/around her umbilicus, after contractions/worse with contractions. Good fetal movements. Some bloody show but no LOF.  Denies visual change but does endorse some headaches "made worse by contractions." States this morning her hands and feet have been slightly swollen and "tingling." Otherwise denies complaints. No fever/chills, N/V, RUQ pain.  Pretnatal care at [facility]. Essentially uncomplicated; pt late to care at 22 weeks, failed 1h glucose test but passed 3h. Significant pain with contractions, known uterine fibroids.  Maternal Medical History:  Reason for admission: Reason for admission: contractions.  Contractions: Onset was 3-5 hours ago.   Frequency: regular.   Perceived severity is strong.    Fetal activity: Perceived fetal activity is normal.   Last perceived fetal movement was within the past hour.    Prenatal complications: no prenatal complications Prenatal Complications - Diabetes: none.    OB History    Grav Para Term Preterm Abortions TAB SAB Ect Mult Living   1 0 0 0 0 0 0 0 0 0      Past Medical History  Diagnosis Date  . Depression     took meds after mother's passing  . Headache    Past Surgical History  Procedure Date  . Ovarian cyst removal 2009   Family History: family history includes Cancer in her paternal aunt; Diabetes in her father; and Heart disease in her mother. Social History:  reports that she has never smoked. She has never used smokeless tobacco. She reports that she does not drink alcohol or use illicit drugs.  ROS: See HPI  Dilation: 3.5 Effacement (%): 90 Station: -2 Exam by:: Dr. Timothy Lasso. Gabriel Earing, RNC Blood pressure  119/79, pulse 88, temperature 97.7 F (36.5 C), temperature source Oral, resp. rate 20, height 5\' 1"  (1.549 m), weight 66.225 kg (146 lb), last menstrual period 09/18/2011. Maternal Exam:  Uterine Assessment: Contraction strength is moderate.  Contraction frequency is regular.   Abdomen: Patient reports the following abdominal tenderness: periumbilical.  Estimated fetal weight is 8lb.   Fetal presentation: vertex  Introitus: Normal vulva. Normal vagina.  Ferning test: not done.  Amniotic fluid character: not assessed.  Pelvis: adequate for delivery.   Cervix: Cervix evaluated by digital exam.     Fetal Exam Fetal Monitor Review: Mode: ultrasound.   Baseline rate: 125.  Variability: moderate (6-25 bpm).   Pattern: accelerations present and variable decelerations.    Fetal State Assessment: Category I - tracings are normal. Rare, isolated variables  Physical Exam  Vitals reviewed. Constitutional: She is oriented to person, place, and time. She appears well-developed and well-nourished. Distressed: very uncomfortable with contractions.  HENT:  Head: Normocephalic and atraumatic.  Eyes: Conjunctivae normal are normal. Pupils are equal, round, and reactive to light.  Neck: Normal range of motion. Neck supple.  Cardiovascular: Normal rate, regular rhythm and normal heart sounds.   No murmur heard. Respiratory: Effort normal and breath sounds normal. She has no wheezes.  GI: Soft. Bowel sounds are normal. There is tenderness in the periumbilical area.       Gravid  Genitourinary: Vagina normal. Pelvic exam was performed with patient prone.       Dilation: 3.5 Effacement (%): 90 Cervical Position: Posterior Station: -2 Presentation: Vertex Exam by:: Dr. Timothy Lasso. Gabriel Earing,  RNC  Musculoskeletal: Normal range of motion. She exhibits no edema.  Neurological: She is alert and oriented to person, place, and time.  Skin: Skin is warm and dry. No erythema.  Psychiatric: She has a  normal mood and affect. Her behavior is normal.    Prenatal labs: ABO, Rh: --/--/O POS (04/15 4098) Antibody:   neg Rubella:   negative RPR:   non-reactive HBsAg:   negative HIV:   non-reactive GBS: Negative (11/22 0000)   Assessment/Plan: 31 y.o. G1P0000 at [redacted]w[redacted]d -SIUP in early active labor  -admit to birthing suites  -routine L&D orders  -GBS NEGATIVE  -pt may have epidural per preference  Above discussed with Dr. Thad Ranger and D. Poe, CNM.  [name] 07/04/2012, 10:09 AM

## 2012-07-04 NOTE — Progress Notes (Signed)
Caitlin Bailey is a 31 y.o. G1P0000 at [redacted]w[redacted]d  Subjective: Comfortable with epidural. Headaches have resolved; pt attributes it to being hungry, earlier. No change in vision. Some abdominal pain with contractions but epidural generally takes away "almost all of the pain."  Objective: BP 119/76  Pulse 95  Temp 98 F (36.7 C) (Oral)  Resp 20  Ht 5\' 1"  (1.549 m)  Wt 66.225 kg (146 lb)  BMI 27.59 kg/m2  SpO2 99%  LMP 09/18/2011   Total I/O In: -  Out: 250 [Urine:250]  FHT:  FHR: 120-130 bpm, variability: moderate,  accelerations:  Present,  decelerations:  Present few variables UC:   regular, every 3 minutes SVE:   Dilation: 7 Effacement (%): 90 Station: -1;0 Exam by:: Dr. Casper Harrison  Labs: Lab Results  Component Value Date   WBC 11.8* 07/04/2012   HGB 14.8 07/04/2012   HCT 43.6 07/04/2012   MCV 91.2 07/04/2012   PLT 245 07/04/2012    Assessment / Plan: Spontaneous labor now with augmentation (Pitocin at 3 milliunits), progressing slowly  Labor: Progressing on Pitocin, will continue to monitor closely Preeclampsia:  No further headaches, BP within normal range Fetal Wellbeing: Overall Category I Pain Control:  Epidural I/D:  n/a Anticipated MOD:  NSVD  Eleanna Theilen, Cristal Deer 07/04/2012, 6:23 PM

## 2012-07-04 NOTE — MAU Note (Signed)
UC's q 5' since 0430

## 2012-07-04 NOTE — Progress Notes (Signed)
Caitlin Bailey is a 31 y.o. G1P0000 at [redacted]w[redacted]d  Subjective: No complaints. Headaches resolved.  Objective: BP 102/65  Pulse 98  Temp 99.6 F (37.6 C) (Oral)  Resp 20  Ht 5\' 1"  (1.549 m)  Wt 66.225 kg (146 lb)  BMI 27.59 kg/m2  SpO2 99%  LMP 09/18/2011 I/O last 3 completed shifts: In: -  Out: 250 [Urine:250] Total I/O In: -  Out: 150 [Urine:150]  FHT:  FHR: 140-150 bpm, variability: moderate,  accelerations:  Present,  decelerations:  Present several variables in the last half hour UC:   regular, every 3-5 minutes SVE:   Dilation: 6.5 Effacement (%): 90 Station: -1;0 Exam by:: [employer] RN  Labs: Lab Results  Component Value Date   WBC 11.8* 07/04/2012   HGB 14.8 07/04/2012   HCT 43.6 07/04/2012   MCV 91.2 07/04/2012   PLT 245 07/04/2012    Assessment / Plan: Spontaneous labor, little cervical change last few checks Several decels last half hour  Labor: Stop Pit for now, likely will place IUPC and monitor contractions. Fluid bolus for decels. Preeclampsia:  No signs/symptoms Fetal Wellbeing:  Category II Pain Control:  Epidural I/D:  n/a Anticipated MOD:  NSVD  Care checked out to V. Chelsea Aus, MD and M. Mayford Knife, CNM  Naliah Eddington 07/04/2012, 8:16 PM

## 2012-07-04 NOTE — Progress Notes (Signed)
NAZLY DIGILIO is a 31 y.o. G1P0000 at [redacted]w[redacted]d  Subjective: No complaints. Comfortable with epidural. Anxious to deliver.  Objective: BP 107/71  Pulse 97  Temp 98.7 F (37.1 C) (Oral)  Resp 20  Ht 5\' 1"  (1.549 m)  Wt 66.225 kg (146 lb)  BMI 27.59 kg/m2  SpO2 99%  LMP 09/18/2011      FHT:  FHR: 130-140 bpm, variability: moderate,  accelerations:  Present,  decelerations:  Present occasional variables UC:   regular, every 3-5 minutes SVE:   Dilation: 5.5 Effacement (%): 90 Station: -1 Exam by:: Dr Casper Harrison  Labs: Lab Results  Component Value Date   WBC 11.8* 07/04/2012   HGB 14.8 07/04/2012   HCT 43.6 07/04/2012   MCV 91.2 07/04/2012   PLT 245 07/04/2012    Assessment / Plan: Spontaneous labor, progressing normally  Labor: Progressing normally, will continue to monitor closely. Fetal Wellbeing:  Category II with occasional variables. Pain Control:  Epidural I/D:  n/a Anticipated MOD:  NSVD  Kandas Oliveto 07/04/2012, 2:02 PM

## 2012-07-04 NOTE — Anesthesia Procedure Notes (Signed)
Epidural Patient location during procedure: OB Start time: 07/04/2012 11:40 AM  Staffing Anesthesiologist: Makyah Lavigne A. Performed by: anesthesiologist   Preanesthetic Checklist Completed: patient identified, site marked, surgical consent, pre-op evaluation, timeout performed, IV checked, risks and benefits discussed and monitors and equipment checked  Epidural Patient position: sitting Prep: site prepped and draped and DuraPrep Patient monitoring: continuous pulse ox and blood pressure Approach: midline Injection technique: LOR air  Needle:  Needle type: Tuohy  Needle gauge: 17 G Needle length: 9 cm and 9 Needle insertion depth: 4 cm Catheter type: closed end flexible Catheter size: 19 Gauge Catheter at skin depth: 9 cm Test dose: negative and Other  Assessment Events: blood not aspirated, injection not painful, no injection resistance, negative IV test and no paresthesia  Additional Notes Patient identified. Risks and benefits discussed including failed block, incomplete  Pain control, post dural puncture headache, nerve damage, paralysis, blood pressure Changes, nausea, vomiting, reactions to medications-both toxic and allergic and post Partum back pain. All questions were answered. Patient expressed understanding and wished to proceed. Sterile technique was used throughout procedure. Epidural site was Dressed with sterile barrier dressing. No paresthesias, signs of intravascular injection Or signs of intrathecal spread were encountered.  Patient was more comfortable after the epidural was dosed. Please see RN's note for documentation of vital signs and FHR which are stable. Spanish interpreter used throughout procedure.

## 2012-07-04 NOTE — H&P (Signed)
I have seen and examined patient and agree with above. FHTs:  125, moderate variability, accels present, occasional variable. Napoleon Form, MD

## 2012-07-04 NOTE — Progress Notes (Signed)
ANTIBIOTIC CONSULT NOTE - INITIAL  Pharmacy Consult for Gentamicin Indication: Maternal temp during L/D; r/o chorioamnionitis  No Known Allergies  Patient Measurements: Height: 5\' 1"  (154.9 cm) Weight: 146 lb (66.225 kg) IBW/kg (Calculated) : 47.8  Adjusted Body Weight: 53.3kg  Vital Signs: Temp: 99.6 F (37.6 C) (12/18 2230) Temp src: Oral (12/18 2230) BP: 119/76 mmHg (12/18 2230) Pulse Rate: 99  (12/18 2230)  Labs:  Basename 07/04/12 1010  WBC 11.8*  HGB 14.8  PLT 245  [facility] --  CREATININE --   Estimated SCr=0.7 with estimated CrCl= > 151ml/min  Medical History: Past Medical History  Diagnosis Date  . Depression     took meds after mother's passing  . Headache     Medications:  Ampicillin 2 gram IV q6h Assessment: 31yo F at 40+ weeks admitted with contractions now with maternal temp during labor.  Goal of Therapy:  Gentamicin peaks 6-72mcg/ml and trough < 82mcg/ml  Plan:  1. Gentamicin 130mg  IV q8h. 2. Will check SCr if continued postpartum. 3. Will continue to follow.  Thanks!  Claybon Jabs 07/04/2012,10:42 PM

## 2012-07-04 NOTE — Progress Notes (Signed)
Caitlin Bailey is a 31 y.o. G1P0000 at [redacted]w[redacted]d  Subjective: Pt is comfortable, she reports headache.  Objective: BP 113/73  Pulse 99  Temp 100.1 F (37.8 C) (Oral)  Resp 20  Ht 5\' 1"  (1.549 m)  Wt 66.225 kg (146 lb)  BMI 27.59 kg/m2  SpO2 99%  LMP 09/18/2011 I/O last 3 completed shifts: In: -  Out: 250 [Urine:250] Total I/O In: -  Out: 150 [Urine:150]  FHT:  FHR: 140-150 bpm, variability: moderate,  accelerations:  Present,  Decelerations: 1 Present variable  UC:   regular, every 3-5 minutes SVE:   Dilation: 7 Effacement (%): 90 Station: -1;0 Exam by:: Rebbeca Paul MD  Labs: Lab Results  Component Value Date   WBC 11.8* 07/04/2012   HGB 14.8 07/04/2012   HCT 43.6 07/04/2012   MCV 91.2 07/04/2012   PLT 245 07/04/2012    Assessment / Plan: Spontaneous labor, little cervical change last few checks Several decels last half hour  Labor: IUPC placed and monitor contractions. Restart Pit Preeclampsia:  No signs/symptoms Fetal Wellbeing:  Category II Pain Control:  Epidural I/D:  n/a Anticipated MOD:  NSVD   Governor Specking 07/04/2012, 9:13 PM

## 2012-07-04 NOTE — Progress Notes (Signed)
Caitlin Bailey is a 31 y.o. G1P0000 at [redacted]w[redacted]d  Subjective: Comfortable with epidural. No complaints.  Objective: BP 112/74  Pulse 106  Temp 97.7 F (36.5 C) (Oral)  Resp 20  Ht 5\' 1"  (1.549 m)  Wt 66.225 kg (146 lb)  BMI 27.59 kg/m2  SpO2 100%  LMP 09/18/2011      FHT:  FHR: 135 bpm, variability: moderate,  accelerations:  Present,  decelerations:  Present occasional variables UC:   regular, every 3-5 minutes SVE:   Dilation: 4.5 Effacement (%): 90 Station: -1 Exam by:: Dr Casper Harrison  Labs: Lab Results  Component Value Date   WBC 11.8* 07/04/2012   HGB 14.8 07/04/2012   HCT 43.6 07/04/2012   MCV 91.2 07/04/2012   PLT 245 07/04/2012    Assessment / Plan: Spontaneous labor, progressing normally  Labor: Progressing normally, AROM at 1208 Fetal Wellbeing:  Category I Pain Control:  Epidural I/D:  n/a Anticipated MOD:  NSVD  Caitlin Bailey 07/04/2012, 12:12 PM

## 2012-07-05 ENCOUNTER — Encounter (HOSPITAL_COMMUNITY): Payer: Self-pay | Admitting: *Deleted

## 2012-07-05 MED ORDER — PRENATAL MULTIVITAMIN CH: 1.0000 | ORAL_TABLET | Freq: Every day | ORAL | Status: DC

## 2012-07-05 MED ORDER — SIMETHICONE 80 MG PO CHEW: 80.0000 mg | CHEWABLE_TABLET | ORAL | Status: DC | PRN

## 2012-07-05 MED ORDER — SENNOSIDES-DOCUSATE SODIUM 8.6-50 MG PO TABS
2.0000 | ORAL_TABLET | Freq: Every day | ORAL | Status: DC
  Administered 2012-07-05 – 2012-07-06 (×2): 2 via ORAL

## 2012-07-05 MED ORDER — WITCH HAZEL-GLYCERIN EX PADS: 1.0000 "application " | MEDICATED_PAD | CUTANEOUS | Status: DC | PRN

## 2012-07-05 MED ORDER — ONDANSETRON HCL 4 MG PO TABS: 4.0000 mg | ORAL_TABLET | ORAL | Status: DC | PRN

## 2012-07-05 MED ORDER — ONDANSETRON HCL 4 MG/2ML IJ SOLN: 4.0000 mg | INTRAMUSCULAR | Status: DC | PRN

## 2012-07-05 MED ORDER — BENZOCAINE-MENTHOL 20-0.5 % EX AERO
1.0000 "application " | INHALATION_SPRAY | CUTANEOUS | Status: DC | PRN
  Administered 2012-07-05: 1 via TOPICAL
  Filled 2012-07-05: qty 56

## 2012-07-05 MED ORDER — DIBUCAINE 1 % RE OINT: 1.0000 "application " | TOPICAL_OINTMENT | RECTAL | Status: DC | PRN

## 2012-07-05 MED ORDER — PRENATAL MULTIVITAMIN CH
1.0000 | ORAL_TABLET | Freq: Every day | ORAL | Status: DC
  Administered 2012-07-06 – 2012-07-07 (×2): 1 via ORAL
  Filled 2012-07-05 (×2): qty 1

## 2012-07-05 MED ORDER — LACTATED RINGERS IV SOLN
INTRAVENOUS | Status: DC
  Administered 2012-07-05: 08:00:00 via INTRAUTERINE
  Administered 2012-07-05: 300 mL via INTRAUTERINE

## 2012-07-05 MED ORDER — ZOLPIDEM TARTRATE 5 MG PO TABS: 5.0000 mg | ORAL_TABLET | Freq: Every evening | ORAL | Status: DC | PRN

## 2012-07-05 MED ORDER — TETANUS-DIPHTH-ACELL PERTUSSIS 5-2.5-18.5 LF-MCG/0.5 IM SUSP: 0.5000 mL | Freq: Once | INTRAMUSCULAR | Status: DC

## 2012-07-05 MED ORDER — SENNOSIDES-DOCUSATE SODIUM 8.6-50 MG PO TABS: 2.0000 | ORAL_TABLET | Freq: Every day | ORAL | Status: DC

## 2012-07-05 MED ORDER — OXYCODONE-ACETAMINOPHEN 5-325 MG PO TABS: 1.0000 | ORAL_TABLET | ORAL | Status: DC | PRN

## 2012-07-05 MED ORDER — DIPHENHYDRAMINE HCL 25 MG PO CAPS: 25.0000 mg | ORAL_CAPSULE | Freq: Four times a day (QID) | ORAL | Status: DC | PRN

## 2012-07-05 MED ORDER — LANOLIN HYDROUS EX OINT: TOPICAL_OINTMENT | CUTANEOUS | Status: DC | PRN

## 2012-07-05 MED ORDER — IBUPROFEN 600 MG PO TABS
600.0000 mg | ORAL_TABLET | Freq: Four times a day (QID) | ORAL | Status: DC
Start: 2012-07-05 — End: 2012-07-05

## 2012-07-05 MED ORDER — BENZOCAINE-MENTHOL 20-0.5 % EX AERO: 1.0000 "application " | INHALATION_SPRAY | CUTANEOUS | Status: DC | PRN

## 2012-07-05 MED ORDER — IBUPROFEN 600 MG PO TABS
600.0000 mg | ORAL_TABLET | Freq: Four times a day (QID) | ORAL | Status: DC
  Administered 2012-07-05 – 2012-07-07 (×9): 600 mg via ORAL
  Filled 2012-07-05 (×8): qty 1

## 2012-07-05 MED ORDER — MISOPROSTOL 200 MCG PO TABS
ORAL_TABLET | ORAL | Status: AC
  Administered 2012-07-05: 800 ug via RECTAL
  Filled 2012-07-05: qty 4

## 2012-07-05 MED ORDER — OXYCODONE-ACETAMINOPHEN 5-325 MG PO TABS
1.0000 | ORAL_TABLET | ORAL | Status: DC | PRN
  Administered 2012-07-05 – 2012-07-06 (×3): 1 via ORAL
  Filled 2012-07-05 (×3): qty 1

## 2012-07-05 NOTE — Progress Notes (Signed)
Patient ID: Caitlin Bailey, female   DOB: March 16, 1981, 31 y.o.   MRN: [id number]  FHR variable decels worsened to occur with every contraction Variability remains good and there are accels and + scalp stim accels  UCs regular, not quite adequate  Dilation: 6.5 Effacement (%): 90 Cervical Position: Middle Station: -1 Presentation: Vertex Exam by:: Artelia Laroche CNM  Better application of cervix to head  Discussed with Dr Despina Hidden Will rest for 20 mn then restart Pitocin

## 2012-07-05 NOTE — Progress Notes (Signed)
Caitlin Bailey is a 31 y.o. G1P0000 at [redacted]w[redacted]d by ultrasound admitted for labor  .  Subjective: Has been comfortable with epidural UCs have increased in frequency but not much in strength.  Objective: BP 104/65  Pulse 91  Temp 99 F (37.2 C) (Oral)  Resp 20  Ht 5\' 1"  (1.549 m)  Wt 146 lb (66.225 kg)  BMI 27.59 kg/m2  SpO2 99%  LMP 09/18/2011 I/O last 3 completed shifts: In: -  Out: 250 [Urine:250] Total I/O In: -  Out: 450 [Urine:450]  FHT:  FHR: 140 bpm, variability: moderate,  accelerations:  Present,  decelerations:  Present Variable with some contractions  Amnioinfusion was started earlier when variables were more frequent  UC:   regular, every 3 minutes SVE:   Dilation: 5.5 Effacement (%): 90 Station: -1;0 Exam by:: Artelia Laroche CNM  Previous exams were probably measured with how far cervix stretched to. Cervix is very soft and stretchy Vertex not well applied to cervix.  Labs: Lab Results  Component Value Date   WBC 11.8* 07/04/2012   HGB 14.8 07/04/2012   HCT 43.6 07/04/2012   MCV 91.2 07/04/2012   PLT 245 07/04/2012    Assessment / Plan: Spontaneous labor, probably has been latent phase all day, now still latent, but progressing toward active phase with Pitocin  Labor: Progressing slowly, prolonged latent phase Preeclampsia:  n/a Fetal Wellbeing:  Category II Pain Control:  Epidural I/D:  n/a Anticipated MOD:  NSVD  Discussed at length with patient via interpretor Will continue to increase Pitocin as fetus tolerates.    [facility] 07/05/2012, 1:48 AM

## 2012-07-05 NOTE — Progress Notes (Signed)
Caitlin Bailey is a 31 y.o. G1P0000 at [redacted]w[redacted]d by ultrasound admitted for active labor  Subjective: Comfortable.   Objective: BP 102/62  Pulse 94  Temp 99.5 F (37.5 C) (Axillary)  Resp 20  Ht 5\' 1"  (1.549 m)  Wt 146 lb (66.225 kg)  BMI 27.59 kg/m2  SpO2 96%  LMP 09/18/2011 I/O last 3 completed shifts: In: -  Out: 250 [Urine:250] Total I/O In: -  Out: 450 [Urine:450]  FHT:  FHR: 145 bpm, variability: moderate,  accelerations:  Present,  decelerations:  Absent UC:   regular, every 3 minutes SVE:   Dilation: Lip/rim Effacement (%): 100 Station: 0 Exam by:: Artelia Laroche CNM  Labs: Lab Results  Component Value Date   WBC 11.8* 07/04/2012   HGB 14.8 07/04/2012   HCT 43.6 07/04/2012   MCV 91.2 07/04/2012   PLT 245 07/04/2012    Assessment / Plan: Spontaneous labor, progressing normally  Labor: Progressing normally Preeclampsia:   Fetal Wellbeing:  Category I Pain Control:  Epidural I/D:  n/a Anticipated MOD:  NSVD  [name] 07/05/2012, 6:25 AM

## 2012-07-05 NOTE — Anesthesia Postprocedure Evaluation (Signed)
  Anesthesia Post-op Note  Patient: Caitlin Bailey  Procedure(s) Performed: * No procedures listed *  Patient Location: Mother/Baby  Anesthesia Type:Epidural  Level of Consciousness: awake, alert  and oriented  Airway and Oxygen Therapy: Patient Spontanous Breathing  Post-op Pain: mild  Post-op Assessment: Post-op Vital signs reviewed, Patient's Cardiovascular Status Stable, No headache, No backache, No residual numbness and No residual motor weakness  Post-op Vital Signs: Reviewed and stable  Complications: No apparent anesthesia complications

## 2012-07-06 LAB — CBC
HCT: 32.9 % — ABNORMAL LOW (ref 36.0–46.0)
MCH: 30.6 pg (ref 26.0–34.0)
MCHC: 33.1 g/dL (ref 30.0–36.0)
RDW: 15 % (ref 11.5–15.5)

## 2012-07-06 NOTE — Progress Notes (Signed)
Post Partum Day #1 Subjective: up ad lib and voiding; breastfeeding going well; states that pediatrician to eval for possible jaundice; condoms for contraception  Objective: Blood pressure 93/57, pulse 85, temperature 98.6 F (37 C), temperature source Oral, resp. rate 18, height 5\' 1"  (1.549 m), weight 66.225 kg (146 lb), last menstrual period 09/18/2011, SpO2 96.00%, unknown if currently breastfeeding.  Physical Exam:  General: alert, cooperative and mild distress Lochia: appropriate Uterine Fundus: firm DVT Evaluation: No evidence of DVT seen on physical exam.   Basename 07/06/12 0440 07/04/12 1010  HGB 10.9* 14.8  HCT 32.9* 43.6    Assessment/Plan: Plan for discharge tomorrow   LOS: 2 days   Cam Hai 07/06/2012, 12:32 PM

## 2012-07-07 MED ORDER — IBUPROFEN 600 MG PO TABS: 600.0000 mg | ORAL_TABLET | Freq: Four times a day (QID) | ORAL | Status: DC

## 2012-07-07 NOTE — Discharge Summary (Signed)
Obstetric Discharge Summary Reason for Admission: onset of labor Prenatal Procedures: none Intrapartum Procedures: spontaneous vaginal delivery Postpartum Procedures: none Complications-Operative and Postpartum: 2nd degree perineal laceration & sulcus lac Hemoglobin  Date Value Range Status  07/06/2012 10.9* 12.0 - 15.0 g/dL Final     DELTA CHECK NOTED     REPEATED TO VERIFY     HCT  Date Value Range Status  07/06/2012 32.9* 36.0 - 46.0 % Final    Physical Exam:  General: alert, cooperative and mild distress Lochia: appropriate Uterine Fundus: firm DVT Evaluation: No evidence of DVT seen on physical exam.  Discharge Diagnoses: Term Pregnancy-delivered  Discharge Information: Date: 07/07/2012 Activity: pelvic rest Diet: routine Medications: PNV and Ibuprofen Condition: stable Instructions: refer to practice specific booklet Discharge to: home Follow-up Information    Follow up with [facility] [place]. (Make a postpartum appointment for 4-6 weeks)    Contact information:   [address] [place] [address] [phone]         Newborn Data: Live born female  Birth Weight: 9 lb 7 oz (4280 g) APGAR: 5, 8  Home with mother. Breastfeeding going well; condoms for contraception.  Cam Hai 07/07/2012, 8:29 AM

## 2012-07-07 NOTE — Clinical Social Work Note (Signed)
MOB was seen by weekday CSW, however no note in system.  No current emotional concerns.  Hx of depression was situational due to death of her mother.   Patient was referred for history of depression/anxiety.  * Referral screened out by Clinical Social Worker because none of the following criteria appear to apply:  ~ History of anxiety/depression during this pregnancy, or of post-partum depression. ~ Diagnosis of anxiety and/or depression within last 3 years ~ History of depression due to pregnancy loss/loss of child  OR  * Patient's symptoms currently being treated with medication and/or therapy.  Please contact the Clinical Social Worker if needs arise, or by the patient's request.

## 2012-07-12 ENCOUNTER — Telehealth (HOSPITAL_COMMUNITY): Payer: Self-pay | Admitting: *Deleted

## 2012-07-12 NOTE — Telephone Encounter (Signed)
Resolve episode 

## 2014-05-19 ENCOUNTER — Encounter (HOSPITAL_COMMUNITY): Payer: Self-pay | Admitting: *Deleted

## 2014-05-29 ENCOUNTER — Emergency Department (HOSPITAL_COMMUNITY)
Admission: EM | Admit: 2014-05-29 | Discharge: 2014-05-30 | Disposition: A | Discharge status: Home / Self Care | Payer: Medicaid Other | Attending: Emergency Medicine | Admitting: Emergency Medicine

## 2014-05-29 ENCOUNTER — Encounter (HOSPITAL_COMMUNITY): Payer: Self-pay | Admitting: Emergency Medicine

## 2014-05-29 ENCOUNTER — Emergency Department (HOSPITAL_COMMUNITY): Payer: Medicaid Other

## 2014-05-29 DIAGNOSIS — R05 Cough: Secondary | ICD-10-CM | POA: Insufficient documentation

## 2014-05-29 DIAGNOSIS — R0602 Shortness of breath: Secondary | ICD-10-CM | POA: Insufficient documentation

## 2014-05-29 DIAGNOSIS — Z79899 Other long term (current) drug therapy: Secondary | ICD-10-CM | POA: Insufficient documentation

## 2014-05-29 DIAGNOSIS — R0789 Other chest pain: Secondary | ICD-10-CM | POA: Insufficient documentation

## 2014-05-29 DIAGNOSIS — R079 Chest pain, unspecified: Secondary | ICD-10-CM

## 2014-05-29 DIAGNOSIS — Z8659 Personal history of other mental and behavioral disorders: Secondary | ICD-10-CM | POA: Insufficient documentation

## 2014-05-29 LAB — BASIC METABOLIC PANEL
Anion gap: 14 (ref 5–15)
BUN: 18 mg/dL (ref 6–23)
CHLORIDE: 104 meq/L (ref 96–112)
CO2: 21 meq/L (ref 19–32)
Calcium: 8.9 mg/dL (ref 8.4–10.5)
Creatinine, Ser: 0.76 mg/dL (ref 0.50–1.10)
GFR calc non Af Amer: >90 mL/min (ref >90)
Glucose, Bld: 103 mg/dL — ABNORMAL HIGH (ref 70–99)
Potassium: 3.8 mEq/L (ref 3.7–5.3)
Sodium: 139 mEq/L (ref 137–147)

## 2014-05-29 LAB — PRO B NATRIURETIC PEPTIDE: PRO B NATRI PEPTIDE: 12.2 pg/mL (ref 0–125)

## 2014-05-29 LAB — CBC
HCT: 40.2 % (ref 36.0–46.0)
Hemoglobin: 13.8 g/dL (ref 12.0–15.0)
MCH: 30.7 pg (ref 26.0–34.0)
MCHC: 34.3 g/dL (ref 30.0–36.0)
MCV: 89.3 fL (ref 78.0–100.0)
PLATELETS: 248 10*3/uL (ref 150–400)
RBC: 4.5 MIL/uL (ref 3.87–5.11)
RDW: 13.2 % (ref 11.5–15.5)
WBC: 7.3 10*3/uL (ref 4.0–10.5)

## 2014-05-29 LAB — D-DIMER, QUANTITATIVE (NOT AT ARMC): D DIMER QUANT: 0.42 ug{FEU}/mL (ref 0.00–0.48)

## 2014-05-29 LAB — I-STAT TROPONIN, ED: Troponin i, poc: 0 ng/mL (ref 0.00–0.08)

## 2014-05-29 NOTE — ED Notes (Signed)
Pt. reports left chest pain radiating to left upper back with SOB onset this afternoon , denies nausea or diaphoresis .

## 2014-05-29 NOTE — ED Notes (Signed)
Family at bedside. 

## 2014-05-29 NOTE — ED Notes (Signed)
Patient transported to X-ray 

## 2014-05-29 NOTE — ED Provider Notes (Signed)
CSN: [id number]     Arrival date & time 05/29/14  2247 History   First MD Initiated Contact with Patient 05/29/14 2320     Chief Complaint  Patient presents with  . Chest Pain     (Consider location/radiation/quality/duration/timing/severity/associated sxs/prior Treatment) HPI Comments: Patient presents to the ER for evaluation of chest pain. Pain began yesterday and has been present continuously ever since. Patient reports a sharp pain in the left upper chest which radiates through into the back. Over the course of today she started to feel short of breath. Patient reports that the area is tender to the touch and her pain worsens when she moves. She has also had a cough over the last several days, has not had any fever. Patient denies any history of blood clots, no family history of clots. Mother did have heart disease in her 38s.  Patient is a 33 y.o. female presenting with chest pain.  Chest Pain Associated symptoms: cough and shortness of breath     Past Medical History  Diagnosis Date  . Depression     took meds after mother's passing  . Headache(784.0)   . Uterine fibroids affecting pregnancy    Past Surgical History  Procedure Laterality Date  . Ovarian cyst removal  2009   Family History  Problem Relation Age of Onset  . Heart disease Mother   . Diabetes Father   . Cancer Paternal Aunt    History  Substance Use Topics  . Smoking status: Never Smoker   . Smokeless tobacco: Never Used  . Alcohol Use: No   OB History    Gravida Para Term Preterm AB TAB SAB Ectopic Multiple Living   1 1 1  0 0 0 0 0 0 1     Review of Systems  Respiratory: Positive for cough and shortness of breath.   Cardiovascular: Positive for chest pain.  All other systems reviewed and are negative.     Allergies  Review of patient's allergies indicates no known allergies.  Home Medications   Prior to Admission medications   Medication Sig Start Date End Date Taking? Authorizing  Provider  ibuprofen (ADVIL,MOTRIN) 600 MG tablet Take 1 tablet (600 mg total) by mouth every 6 (six) hours. 07/07/12   Myrtis Ser, CNM  Prenatal Vit-Fe Fumarate-FA (PRENATAL MULTIVITAMIN) TABS Take 1 tablet by mouth daily.    Historical Provider, MD   BP 103/66 mmHg  Pulse 67  Temp(Src) 98.3 F (36.8 C)  Resp 17  SpO2 98%  LMP 05/06/2014 Physical Exam  Constitutional: She is oriented to person, place, and time. She appears well-developed and well-nourished. No distress.  HENT:  Head: Normocephalic and atraumatic.  Right Ear: Hearing normal.  Left Ear: Hearing normal.  Nose: Nose normal.  Mouth/Throat: Oropharynx is clear and moist and mucous membranes are normal.  Eyes: Conjunctivae and EOM are normal. Pupils are equal, round, and reactive to light.  Neck: Normal range of motion. Neck supple.  Cardiovascular: Regular rhythm, S1 normal and S2 normal.  Exam reveals no gallop and no friction rub.   No murmur heard. Pulmonary/Chest: Effort normal and breath sounds normal. No respiratory distress. She exhibits tenderness.    Abdominal: Soft. Normal appearance and bowel sounds are normal. There is no hepatosplenomegaly. There is no tenderness. There is no rebound, no guarding, no tenderness at McBurney's point and negative Murphy's sign. No hernia.  Musculoskeletal: Normal range of motion.  Neurological: She is alert and oriented to person, place, and time. She  has normal strength. No cranial nerve deficit or sensory deficit. Coordination normal. GCS eye subscore is 4. GCS verbal subscore is 5. GCS motor subscore is 6.  Skin: Skin is warm, dry and intact. No rash noted. No cyanosis.  Psychiatric: She has a normal mood and affect. Her speech is normal and behavior is normal. Thought content normal.  Nursing note and vitals reviewed.   ED Course  Procedures (including critical care time) Labs Review Labs Reviewed  BASIC METABOLIC PANEL - Abnormal; Notable for the following:     Glucose, Bld 103 (*)    All other components within normal limits  CBC  PRO B NATRIURETIC PEPTIDE  D-DIMER, QUANTITATIVE  I-STAT TROPOININ, ED    Imaging Review Dg Chest 2 View  05/29/2014   CLINICAL DATA:  Left-sided chest pain with shortness of breath onset this afternoon.  EXAM: CHEST  2 VIEW  COMPARISON:  None.  FINDINGS: The heart size and mediastinal contours are within normal limits. Both lungs are clear. The visualized skeletal structures are unremarkable.  IMPRESSION: No active cardiopulmonary disease.   Electronically Signed   By: Marin Olp M.D.   On: 05/29/2014 23:49     EKG Interpretation   Date/Time:  Thursday May 29 2014 22:49:27 EST Ventricular Rate:  82 PR Interval:  148 QRS Duration: 92 QT Interval:  356 QTC Calculation: 415 R Axis:   139 Text Interpretation:  Normal sinus rhythm Right axis deviation Abnormal  ECG No previous tracing Confirmed by POLLINA  MD, CHRISTOPHER [phone]) on  05/29/2014 11:28:26 PM      MDM   Final diagnoses:  Chest pain    Patient presents to the ER for evaluation of left-sided chest pain. Patient reports onset of pain yesterday which has been continuous. She has had cough for the last several days, however. Chest x-ray was clear, no pneumonia. Patient's symptoms are extremely atypical for cardiac chest pain. EKG was unremarkable. Troponin negative. Patient was also felt to be atypical for PE. Pain was very reproducible with movement and palpation. Wells/PERC criteria negative. D-dimer also negative. No further workup necessary. Patient reassured, rest and analgesia.    Orpah Greek, MD 05/30/14 [phone]

## 2014-05-30 MED ORDER — TRAMADOL HCL 50 MG PO TABS: 50.0000 mg | ORAL_TABLET | Freq: Four times a day (QID) | ORAL | Status: DC | PRN

## 2014-05-30 MED ORDER — IBUPROFEN 800 MG PO TABS: 800.0000 mg | ORAL_TABLET | Freq: Three times a day (TID) | ORAL | Status: DC

## 2014-05-30 NOTE — ED Notes (Signed)
Patient is alert and orientedx4.  Patient was explained discharge instructions and they understood them with no questions.  The patient's husband, Caitlin Bailey is taking the patient home.

## 2014-05-30 NOTE — Discharge Instructions (Signed)
Dolor de la pared torácica °(Chest Wall Pain) °Dolor en la pared torácica es dolor en o alrededor de los huesos y músculos de su pecho. Podrán pasar hasta 6 semanas hasta que comience a mejorar. Puede demorar más tiempo si es físicamente activo en su trabajo y actividades.  °CAUSAS  °El dolor en el pecho puede aparecer sin motivo. No obstante, algunas causas pueden ser:  °· Una enfermedad viral como la gripe. °· Traumatismos. °· Tos. °· La práctica de ejercicios. °· Artritis. °· Fibromialgia °· Culebrilla. °INSTRUCCIONES PARA EL CUIDADO DOMICILIARIO °· Evite hacer actividad física extenuante. Trate de no esforzarse o realizar actividades que le causen dolor. Aquí se incluyen las actividades en las que usa los músculos del tórax, los abdominales y los músculos laterales, especialmente si debe levantar objetos pesados. °· Aplique hielo sobre la zona dolorida. °¨ Ponga el hielo en una bolsa plástica. °¨ Colóquese una toalla entre la piel y la bolsa de hielo. °¨ Deje la bolsa de hielo durante 15 a 20 minutos por hora, durante los primeros 2 días. °· Utilice los medicamentos de venta libre o de prescripción para el dolor, el malestar o la fiebre, según se lo indique el profesional que lo asiste. °SOLICITE ATENCIÓN MÉDICA DE INMEDIATO SI: °· El dolor aumenta o siente muchas molestias. °· Tiene fiebre. °· El dolor de pecho empeora. °· Desarrolla nuevos e inexplicables síntomas. °· Tiene náuseas o vómitos. °· Transpira o se siente mareado. °· Tiene tos con flema (esputo), o tose con sangre. °ESTÉ SEGURO QUE:  °· Comprende las instrucciones para el alta médica. °· Controlará su enfermedad. °· Solicitará atención médica de inmediato según las indicaciones. °Document Released: 08/15/2006 Document Revised: 09/26/2011 °ExitCare® Patient Information ©2015 [employer]. This information is not intended to replace advice given to you by your health care provider. Make sure you discuss any questions you have with your health care  provider. ° °

## 2014-07-18 NOTE — L&D Delivery Note (Signed)
Delivery Note At 5:47 PM a viable female was delivered via  (Presentation: ;  ).  APGAR: , ; weight  .   Placenta status: , .  Cord:  with the following complications: .  Cord pH: not done  Anesthesia:   Episiotomy:   Lacerations:   Suture Repair: 2.0 vicryl Est. Blood Loss (mL):    Mom to postpartum.  Baby to Couplet care / Skin to Skin.  MARSHALL,BERNARD A 02/20/2015, 5:59 PM

## 2014-09-11 LAB — OB RESULTS CONSOLE GC/CHLAMYDIA
CHLAMYDIA, DNA PROBE: NEGATIVE
Gonorrhea: NEGATIVE

## 2014-09-11 LAB — OB RESULTS CONSOLE HIV ANTIBODY (ROUTINE TESTING): HIV: NONREACTIVE

## 2014-09-11 LAB — OB RESULTS CONSOLE RUBELLA ANTIBODY, IGM: Rubella: IMMUNE

## 2014-09-11 LAB — OB RESULTS CONSOLE RPR: RPR: NONREACTIVE

## 2014-09-11 LAB — OB RESULTS CONSOLE HEPATITIS B SURFACE ANTIGEN: Hepatitis B Surface Ag: NEGATIVE

## 2014-09-11 LAB — PROCEDURE REPORT - SCANNED: Pap: NEGATIVE

## 2015-01-20 LAB — OB RESULTS CONSOLE GBS: GBS: NEGATIVE

## 2015-02-20 ENCOUNTER — Inpatient Hospital Stay (HOSPITAL_COMMUNITY): Payer: Medicaid Other | Admitting: Anesthesiology

## 2015-02-20 ENCOUNTER — Encounter (HOSPITAL_COMMUNITY): Payer: Self-pay | Admitting: *Deleted

## 2015-02-20 ENCOUNTER — Inpatient Hospital Stay (HOSPITAL_COMMUNITY)
Admission: EM | Admit: 2015-02-20 | Discharge: 2015-02-22 | DRG: 775 | Disposition: A | Discharge status: Home / Self Care | Payer: Medicaid Other | Source: Ambulatory Visit | Attending: Obstetrics | Admitting: Obstetrics

## 2015-02-20 DIAGNOSIS — Z3A40 40 weeks gestation of pregnancy: Secondary | ICD-10-CM | POA: Diagnosis present

## 2015-02-20 DIAGNOSIS — IMO0001 Reserved for inherently not codable concepts without codable children: Secondary | ICD-10-CM

## 2015-02-20 LAB — CBC
HEMATOCRIT: 39.5 % (ref 36.0–46.0)
Hemoglobin: 13.4 g/dL (ref 12.0–15.0)
MCH: 31.5 pg (ref 26.0–34.0)
MCHC: 33.9 g/dL (ref 30.0–36.0)
MCV: 92.7 fL (ref 78.0–100.0)
Platelets: 190 10*3/uL (ref 150–400)
RBC: 4.26 MIL/uL (ref 3.87–5.11)
RDW: 15 % (ref 11.5–15.5)
WBC: 8 10*3/uL (ref 4.0–10.5)

## 2015-02-20 LAB — TYPE AND SCREEN
ABO/RH(D): O POS
ANTIBODY SCREEN: NEGATIVE

## 2015-02-20 MED ORDER — LACTATED RINGERS IV SOLN
500.0000 mL | INTRAVENOUS | Status: DC | PRN
  Administered 2015-02-20: 1000 mL via INTRAVENOUS

## 2015-02-20 MED ORDER — TETANUS-DIPHTH-ACELL PERTUSSIS 5-2.5-18.5 LF-MCG/0.5 IM SUSP
0.5000 mL | Freq: Once | INTRAMUSCULAR | Status: AC
  Administered 2015-02-21: 0.5 mL via INTRAMUSCULAR
  Filled 2015-02-20: qty 0.5

## 2015-02-20 MED ORDER — DIPHENHYDRAMINE HCL 25 MG PO CAPS: 25.0000 mg | ORAL_CAPSULE | Freq: Four times a day (QID) | ORAL | Status: DC | PRN

## 2015-02-20 MED ORDER — DIPHENHYDRAMINE HCL 50 MG/ML IJ SOLN: 12.5000 mg | INTRAMUSCULAR | Status: DC | PRN

## 2015-02-20 MED ORDER — OXYTOCIN 40 UNITS IN LACTATED RINGERS INFUSION - SIMPLE MED: 1.0000 m[IU]/min | INTRAVENOUS | Status: DC

## 2015-02-20 MED ORDER — FLEET ENEMA 7-19 GM/118ML RE ENEM: 1.0000 | ENEMA | RECTAL | Status: DC | PRN

## 2015-02-20 MED ORDER — PRENATAL MULTIVITAMIN CH
1.0000 | ORAL_TABLET | Freq: Every day | ORAL | Status: DC
  Administered 2015-02-21: 1 via ORAL
  Filled 2015-02-20: qty 1

## 2015-02-20 MED ORDER — FERROUS SULFATE 325 (65 FE) MG PO TABS
325.0000 mg | ORAL_TABLET | Freq: Two times a day (BID) | ORAL | Status: DC
  Administered 2015-02-21 – 2015-02-22 (×3): 325 mg via ORAL
  Filled 2015-02-20 (×3): qty 1

## 2015-02-20 MED ORDER — EPHEDRINE 5 MG/ML INJ
10.0000 mg | INTRAVENOUS | Status: DC | PRN
  Filled 2015-02-20: qty 2

## 2015-02-20 MED ORDER — LACTATED RINGERS IV SOLN
INTRAVENOUS | Status: DC
  Administered 2015-02-20 (×2): via INTRAVENOUS

## 2015-02-20 MED ORDER — ACETAMINOPHEN 325 MG PO TABS: 650.0000 mg | ORAL_TABLET | ORAL | Status: DC | PRN

## 2015-02-20 MED ORDER — PHENYLEPHRINE 40 MCG/ML (10ML) SYRINGE FOR IV PUSH (FOR BLOOD PRESSURE SUPPORT)
80.0000 ug | PREFILLED_SYRINGE | INTRAVENOUS | Status: DC | PRN
  Filled 2015-02-20: qty 2
  Filled 2015-02-20: qty 20

## 2015-02-20 MED ORDER — ONDANSETRON HCL 4 MG PO TABS: 4.0000 mg | ORAL_TABLET | ORAL | Status: DC | PRN

## 2015-02-20 MED ORDER — OXYTOCIN BOLUS FROM INFUSION
500.0000 mL | INTRAVENOUS | Status: DC
  Administered 2015-02-20: 500 mL via INTRAVENOUS

## 2015-02-20 MED ORDER — LIDOCAINE HCL (PF) 1 % IJ SOLN
INTRAMUSCULAR | Status: DC | PRN
  Administered 2015-02-20 (×2): 4 mL

## 2015-02-20 MED ORDER — SENNOSIDES-DOCUSATE SODIUM 8.6-50 MG PO TABS
2.0000 | ORAL_TABLET | ORAL | Status: DC
  Administered 2015-02-21 (×2): 2 via ORAL
  Filled 2015-02-20 (×3): qty 2

## 2015-02-20 MED ORDER — LIDOCAINE HCL (PF) 1 % IJ SOLN
30.0000 mL | INTRAMUSCULAR | Status: DC | PRN
  Filled 2015-02-20: qty 30

## 2015-02-20 MED ORDER — LANOLIN HYDROUS EX OINT: TOPICAL_OINTMENT | CUTANEOUS | Status: DC | PRN

## 2015-02-20 MED ORDER — DIBUCAINE 1 % RE OINT: 1.0000 "application " | TOPICAL_OINTMENT | RECTAL | Status: DC | PRN

## 2015-02-20 MED ORDER — OXYCODONE-ACETAMINOPHEN 5-325 MG PO TABS
1.0000 | ORAL_TABLET | ORAL | Status: DC | PRN
  Administered 2015-02-21 (×3): 1 via ORAL
  Filled 2015-02-20: qty 1

## 2015-02-20 MED ORDER — ONDANSETRON HCL 4 MG/2ML IJ SOLN: 4.0000 mg | INTRAMUSCULAR | Status: DC | PRN

## 2015-02-20 MED ORDER — BENZOCAINE-MENTHOL 20-0.5 % EX AERO
1.0000 "application " | INHALATION_SPRAY | CUTANEOUS | Status: DC | PRN
  Filled 2015-02-20: qty 56

## 2015-02-20 MED ORDER — ONDANSETRON HCL 4 MG/2ML IJ SOLN: 4.0000 mg | Freq: Four times a day (QID) | INTRAMUSCULAR | Status: DC | PRN

## 2015-02-20 MED ORDER — TERBUTALINE SULFATE 1 MG/ML IJ SOLN
0.2500 mg | Freq: Once | INTRAMUSCULAR | Status: DC | PRN
  Filled 2015-02-20: qty 1

## 2015-02-20 MED ORDER — IBUPROFEN 600 MG PO TABS
600.0000 mg | ORAL_TABLET | Freq: Four times a day (QID) | ORAL | Status: DC
  Administered 2015-02-20 – 2015-02-22 (×7): 600 mg via ORAL
  Filled 2015-02-20 (×7): qty 1

## 2015-02-20 MED ORDER — CITRIC ACID-SODIUM CITRATE 334-500 MG/5ML PO SOLN: 30.0000 mL | ORAL | Status: DC | PRN

## 2015-02-20 MED ORDER — ZOLPIDEM TARTRATE 5 MG PO TABS: 5.0000 mg | ORAL_TABLET | Freq: Every evening | ORAL | Status: DC | PRN

## 2015-02-20 MED ORDER — OXYCODONE-ACETAMINOPHEN 5-325 MG PO TABS
2.0000 | ORAL_TABLET | ORAL | Status: DC | PRN
  Administered 2015-02-20: 2 via ORAL
  Filled 2015-02-20: qty 2

## 2015-02-20 MED ORDER — SIMETHICONE 80 MG PO CHEW
80.0000 mg | CHEWABLE_TABLET | ORAL | Status: DC | PRN
Start: 2015-02-20 — End: 2015-02-22

## 2015-02-20 MED ORDER — OXYTOCIN 40 UNITS IN LACTATED RINGERS INFUSION - SIMPLE MED
62.5000 mL/h | INTRAVENOUS | Status: DC
  Filled 2015-02-20: qty 1000

## 2015-02-20 MED ORDER — OXYCODONE-ACETAMINOPHEN 5-325 MG PO TABS: 2.0000 | ORAL_TABLET | ORAL | Status: DC | PRN

## 2015-02-20 MED ORDER — FENTANYL 2.5 MCG/ML BUPIVACAINE 1/10 % EPIDURAL INFUSION (WH - ANES)
14.0000 mL/h | INTRAMUSCULAR | Status: DC | PRN
Start: 2015-02-20 — End: 2015-02-20
  Administered 2015-02-20 (×2): 14 mL/h via EPIDURAL
  Filled 2015-02-20: qty 125

## 2015-02-20 MED ORDER — WITCH HAZEL-GLYCERIN EX PADS: 1.0000 "application " | MEDICATED_PAD | CUTANEOUS | Status: DC | PRN

## 2015-02-20 MED ORDER — OXYCODONE-ACETAMINOPHEN 5-325 MG PO TABS
1.0000 | ORAL_TABLET | ORAL | Status: DC | PRN
  Filled 2015-02-20 (×2): qty 1

## 2015-02-20 NOTE — Anesthesia Procedure Notes (Addendum)
Epidural Patient location during procedure: OB Start time: 02/20/2015 1:14 PM  Staffing Anesthesiologist: Lauretta Grill Performed by: anesthesiologist   Preanesthetic Checklist Completed: patient identified, site marked, surgical consent, pre-op evaluation, timeout performed, IV checked, risks and benefits discussed and monitors and equipment checked  Epidural Patient position: sitting Prep: site prepped and draped and DuraPrep Patient monitoring: continuous pulse ox and blood pressure Approach: midline Location: L3-L4 Injection technique: LOR saline  Needle:  Needle type: Tuohy  Needle gauge: 17 G Needle length: 9 cm and 9 Needle insertion depth: 6 cm Catheter type: closed end flexible Catheter size: 19 Gauge Catheter at skin depth: 10 cm Test dose: negative  Assessment Events: blood not aspirated, injection not painful, no injection resistance, negative IV test and no paresthesia  Additional Notes Patient identified. Risks/Benefits/Options discussed with patient including but not limited to bleeding, infection, nerve damage, paralysis, failed block, incomplete pain control, headache, blood pressure changes, nausea, vomiting, reactions to medication both or allergic, itching and postpartum back pain. Confirmed with bedside nurse the patient's most recent platelet count. Confirmed with patient that they are not currently taking any anticoagulation, have any bleeding history or any family history of bleeding disorders. Patient expressed understanding and wished to proceed. All questions were answered. Sterile technique was used throughout the entire procedure. Please see nursing notes for vital signs. Test dose was given through epidural catheter and negative prior to continuing to dose epidural or start infusion. Warning signs of high block given to the patient including shortness of breath, tingling/numbness in hands, complete motor block, or any concerning symptoms with instructions to  call for help. Patient was given instructions on fall risk and not to get out of bed. All questions and concerns addressed with instructions to call with any issues or inadequate analgesia.    Heme in catheter on first pass. Cleared catheter with saline and pulled back to 3cm in the epidural space. Again heme aspirated. Removed epidural catheter and replaced with new catheter. First pass, same LOR, no heme noted, negative test dose.

## 2015-02-20 NOTE — H&P (Signed)
This is Dr. Gracy Racer dictating the history and physical on  Caitlin Bailey she is a 34 year old gravida 2 para 07/18/1936 weeks and 4 days in labor [facility] 12/17/2014 negative GBS she 6 cm 90% vertex -1 station membranes intact Past medical history negative Past surgical history negative Social history negative Family history negative System review negative Physical exam well-developed female in labor HEENT negative Lungs clear to P&A Heart regular rhythm no murmurs no gallops Breasts negative Abdomen term Pelvic as described above Extremities negative

## 2015-02-20 NOTE — MAU Note (Signed)
Pt states U/C's started at 0900 and are becoming more intense.  Reports every 5 minutes.  No vaginal bleeding or ROM.  Good fetal movement.

## 2015-02-20 NOTE — Anesthesia Preprocedure Evaluation (Addendum)
Anesthesia Evaluation  Patient identified by MRN, date of birth, ID band Patient awake    Reviewed: Allergy & Precautions, NPO status , Patient's Chart, lab work & pertinent test results  History of Anesthesia Complications Negative for: history of anesthetic complications  Airway Mallampati: II  TM Distance: >3 FB Neck ROM: Full    Dental no notable dental hx. (+) Dental Advisory Given   Pulmonary neg pulmonary ROS,  breath sounds clear to auscultation  Pulmonary exam normal       Cardiovascular negative cardio ROS Normal cardiovascular examRhythm:Regular Rate:Normal     Neuro/Psych  Headaches, PSYCHIATRIC DISORDERS Anxiety Depression    GI/Hepatic negative GI ROS, Neg liver ROS,   Endo/Other  negative endocrine ROS  Renal/GU negative Renal ROS  negative genitourinary   Musculoskeletal negative musculoskeletal ROS (+)   Abdominal   Peds negative pediatric ROS (+)  Hematology negative hematology ROS (+)   Anesthesia Other Findings   Reproductive/Obstetrics (+) Pregnancy                             Anesthesia Physical Anesthesia Plan  ASA: II  Anesthesia Plan: Epidural   Post-op Pain Management:    Induction:   Airway Management Planned:   Additional Equipment:   Intra-op Plan:   Post-operative Plan:   Informed Consent: I have reviewed the patients History and Physical, chart, labs and discussed the procedure including the risks, benefits and alternatives for the proposed anesthesia with the patient or authorized representative who has indicated his/her understanding and acceptance.   Dental advisory given  Plan Discussed with: CRNA  Anesthesia Plan Comments: (Interpreter used)       Anesthesia Quick Evaluation

## 2015-02-20 NOTE — Progress Notes (Signed)
Spanish interpreter called by RN to explain bladder scan and I&O catheter after patient unable to void when taken to bathroom.  Spanish interpreter present at 20:00 and explained procedures to patient & answered patient's questions.

## 2015-02-21 LAB — CBC
HEMATOCRIT: 38 % (ref 36.0–46.0)
Hemoglobin: 12.6 g/dL (ref 12.0–15.0)
MCH: 30.8 pg (ref 26.0–34.0)
MCHC: 33.2 g/dL (ref 30.0–36.0)
MCV: 92.9 fL (ref 78.0–100.0)
Platelets: 177 10*3/uL (ref 150–400)
RBC: 4.09 MIL/uL (ref 3.87–5.11)
RDW: 15.1 % (ref 11.5–15.5)
WBC: 10.9 10*3/uL — AB (ref 4.0–10.5)

## 2015-02-21 LAB — RPR: RPR Ser Ql: NONREACTIVE

## 2015-02-21 NOTE — Progress Notes (Signed)
Checked on patients needs. Ordered Patients breakfast Spanish Interpreter

## 2015-02-21 NOTE — Progress Notes (Signed)
CLINICAL SOCIAL WORK MATERNAL/CHILD NOTE  Patient Details  Name: Caitlin Bailey MRN: [id number] Date of Birth: 02/20/2013  Date: 02/21/2013  Clinical Social Worker Initiating Note: Norlene Duel, LCSWDate/ Time Initiated: 02/21/15/1150   Child's Name: Estill Dooms   Legal Guardian:  (Parents Genene Churn and Milana Kidney)   Need for Interpreter: None, Spanish (Debbie translated)   Date of Referral: 02/20/15   Reason for Referral: Other (Comment)   Referral Source: [facility]   Address: [address] [place], [place] [address]  Phone number:  [phone])   Household Members: Minor Children   Natural Supports (not living in the home): Extended Family, Immediate Family   Professional Supports:None   Employment: (FO[employer] is employed)   Type of Work:     Education: Art gallery manager: (Mother plans to apply for medicaid and is aware of how to apply for [facility])   Other Resources:     Cultural/Religious Considerations Which May Impact Care:  Acknowledged order for social work consult to assess mother's hx of Depression. Met with mother who was pleasant and receptive to social work. Parents reside together and have one other dependent age 35. Mother reports hx of PP Depression after the birth of her son. Informed that she did not seek treatment and the symptoms eventually resided. She denies any current symptoms of depression or anxiety. She also denies hx of illicit drug use. No acute social concerns noted or reported at this time. She reports having an excellent support system. Mother informed of social work Fish farm manager. Provided information and resources on PP Depression No further intervention required No barriers to discharge  Strengths: Ability to meet basic needs , Home prepared for child    Risk Factors/Current Problems: None   Cognitive State: Alert , Able to  Concentrate    Mood/Affect: Happy , Calm    CSW Assessment: Acknowledged order for social work consult to assess mother's hx of Depression. Met with mother who was pleasant and receptive to social work. Parents reside together and have one other dependent age 32. Mother reports hx of PP Depression after the birth of her son. Informed that she did not seek treatment and the symptoms eventually resided. She denies any current symptoms of depression or anxiety. She also denies hx of illicit drug use. No acute social concerns noted or reported at this time. She reports having an excellent support system. Mother informed of social work Fish farm manager. Provided information and resources on PP Depression  CSW Plan/Description:    Information and Resources for PP Depression provided No further intervention required No barriers to discharge   Jarrius Huaracha J, LCSW 02/21/2015, 3:12 PM

## 2015-02-21 NOTE — Anesthesia Postprocedure Evaluation (Signed)
Anesthesia Post Note  Patient: Caitlin Bailey  Procedure(s) Performed: * No procedures listed *  Anesthesia type: Epidural  Patient location: Mother/Baby  Post pain: Pain level controlled  Post assessment: Post-op Vital signs reviewed  Last Vitals:  Filed Vitals:   02/21/15 0624  BP: 100/56  Pulse: 77  Temp: 36.9 C  Resp: 18    Post vital signs: Reviewed  Level of consciousness:alert  Complications: No apparent anesthesia complications

## 2015-02-21 NOTE — Progress Notes (Signed)
Patient ID: Caitlin Bailey, female   DOB: 05/13/1981, 34 y.o.   MRN: [id number] Postpartum day one Blood pressure 95 with 60 respiration 18 pulse 72 Fundus firm Lochia moderate Legs negative doing well

## 2015-02-21 NOTE — Progress Notes (Signed)
Checked on patients needs.  Ordered patient dinner Administrator, sports

## 2015-02-21 NOTE — Lactation Note (Signed)
This note was copied from the chart of Caitlin Bailey. Lactation Consultation Note Initial visit at 108 hours of age with [place] interpreter.  Mom is holding baby dressed and in blanket.  Baby is screaming and arching back, mom attempting to burp baby.  Discussed STS and mom allowed.  Baby vigorous to latch.  Mom allows shallow latch in cradle hold.  Discussed cross cradle hold with wide open mouth for deep latch.  Mom has easily expressed colostrum.  Nipples are large everted and pink and sore.  Baby can get a good deep latch, but mom is still having pain.  Baby on for a few minutes and then fussy again, baby soon calmed when STS.  [facility] LC resources given and discussed.  Encouraged to feed with early cues on demand.  Early newborn behavior discussed.  Encouraged hand expression with colostrum rubbd into nipples.  Mom declines comfort gels.    Mom to call for assist as needed.    Patient Name: Caitlin Bailey [id number] Date: 02/21/2015 Reason for consult: Initial assessment   Maternal Data Has patient been taught Hand Expression?: Yes Does the patient have breastfeeding experience prior to this delivery?: Yes  Feeding Feeding Type: Breast Fed Length of feed: 5 min  LATCH Score/Interventions Latch: Grasps breast easily, tongue down, lips flanged, rhythmical sucking.  Audible Swallowing: A few with stimulation Intervention(s): Skin to skin;Hand expression;Alternate breast massage  Type of Nipple: Everted at rest and after stimulation  Comfort (Breast/Nipple): Filling, red/small blisters or bruises, mild/mod discomfort  Problem noted: Mild/Moderate discomfort Interventions (Mild/moderate discomfort): Hand expression (offered comfort gels)  Hold (Positioning): Assistance needed to correctly position infant at breast and maintain latch. Intervention(s): Breastfeeding basics reviewed;Support Pillows;Position options;Skin to skin  LATCH Score: 7  Lactation Tools Discussed/Used      Consult Status Consult Status: Follow-up Date: 02/22/15 Follow-up type: In-patient    Justice Britain 02/21/2015, 9:43 PM

## 2015-02-22 NOTE — Lactation Note (Signed)
This note was copied from the chart of Caitlin Bailey. Lactation Consultation Note  Velna Hatchet Interpreter used [phone] Baby latched in cross cradle upon entering.   Sucks and some swallows observed.  Encouraged mother to massage breast during feeding to keep baby active. Mom encouraged to feed baby 8-12 times/24 hours and with feeding cues.  Reviewed cluster feeding, engorgement care and monitoring voids/stools.   Patient Name: Caitlin Meghen Akopyan [id number] Date: 02/22/2015 Reason for consult: Follow-up assessment   Maternal Data    Feeding Feeding Type: Breast Fed  LATCH Score/Interventions Latch: Grasps breast easily, tongue down, lips flanged, rhythmical sucking. (latched upon entering)  Audible Swallowing: A few with stimulation Intervention(s): Alternate breast massage  Type of Nipple: Everted at rest and after stimulation  Comfort (Breast/Nipple): Soft / non-tender     Hold (Positioning): No assistance needed to correctly position infant at breast.  LATCH Score: 9  Lactation Tools Discussed/Used     Consult Status Consult Status: Complete    Carlye Grippe 02/22/2015, 10:26 AM

## 2015-02-22 NOTE — Discharge Instructions (Signed)
Discharge instructions   You can wash your hair  Shower  Eat what you want  Drink what you want  See me in 6 weeks  Your ankles are going to swell more in the next 2 weeks than when pregnant  No sex for 6 weeks   Sheetal Lyall A, MD 02/22/2015

## 2015-02-22 NOTE — Discharge Summary (Signed)
Obstetric Discharge Summary Reason for Admission: induction of labor Prenatal Procedures: none Intrapartum Procedures: spontaneous vaginal delivery Postpartum Procedures: none Complications-Operative and Postpartum: none HEMOGLOBIN  Date Value Ref Range Status  02/21/2015 12.6 12.0 - 15.0 g/dL Final   HCT  Date Value Ref Range Status  02/21/2015 38.0 36.0 - 46.0 % Final    Physical Exam:  General: alert Lochia: appropriate Uterine Fundus: firm Incision: healing well DVT Evaluation: No evidence of DVT seen on physical exam.  Discharge Diagnoses: Term Pregnancy-delivered  Discharge Information: Date: 02/22/2015 Activity: pelvic rest Diet: routine Medications: Percocet Condition: improved Instructions: refer to practice specific booklet Discharge to: home Follow-up Information    Follow up with Frederico Hamman, MD.   Specialty:  Obstetrics and Gynecology   Contact information:   [address] [place] [address] [phone]       Newborn Data: Live born female  Birth Weight: 8 lb 4.5 oz (3755 g) APGAR: 9, 9  Home with mother.  MARSHALL,BERNARD A 02/22/2015, 5:22 AM

## 2015-02-22 NOTE — Progress Notes (Signed)
Patient ID: Caitlin Bailey, female   DOB: [birth year], 34 y.o.   MRN: [id number] Postpartum day 2 blood pressure 106/70 respiration 18 pulse 86 Fundus firm Moderate legs  negative doing well home today

## 2016-04-12 ENCOUNTER — Encounter: Payer: Self-pay | Admitting: *Deleted

## 2016-05-28 ENCOUNTER — Encounter (HOSPITAL_COMMUNITY): Payer: Self-pay

## 2016-05-28 ENCOUNTER — Emergency Department (HOSPITAL_COMMUNITY)
Admission: EM | Admit: 2016-05-28 | Discharge: 2016-05-28 | Disposition: A | Discharge status: Home / Self Care | Payer: Medicaid Other | Attending: Emergency Medicine | Admitting: Emergency Medicine

## 2016-05-28 DIAGNOSIS — B029 Zoster without complications: Secondary | ICD-10-CM | POA: Insufficient documentation

## 2016-05-28 MED ORDER — VALACYCLOVIR HCL 1 G PO TABS: 1000.0000 mg | ORAL_TABLET | Freq: Three times a day (TID) | ORAL | 0 refills | Status: DC

## 2016-05-28 MED ORDER — VALACYCLOVIR HCL 500 MG PO TABS
1000.0000 mg | ORAL_TABLET | Freq: Every day | ORAL | Status: DC
  Administered 2016-05-28: 1000 mg via ORAL
  Filled 2016-05-28: qty 2

## 2016-05-28 MED ORDER — PREDNISONE 20 MG PO TABS: 40.0000 mg | ORAL_TABLET | Freq: Every day | ORAL | 0 refills | Status: DC

## 2016-05-28 MED ORDER — PREDNISONE 20 MG PO TABS
60.0000 mg | ORAL_TABLET | Freq: Once | ORAL | Status: AC
  Administered 2016-05-28: 60 mg via ORAL
  Filled 2016-05-28: qty 3

## 2016-05-28 NOTE — ED Notes (Signed)
No drainage noted to rash

## 2016-05-28 NOTE — ED Triage Notes (Addendum)
Pt states she has a rash on right back that goes onto right breast for 5 days. Very painful

## 2016-05-28 NOTE — ED Provider Notes (Signed)
[facility] DEPT Provider Note   CSN: [id number] Arrival date & time: 05/28/16  [id number]     History   Chief Complaint Chief Complaint  Patient presents with  . Rash    HPI Caitlin Bailey is a 35 y.o. female who presents with a rash. No significant past medical history. She has never had a similar rash before. She is unsure of having chickenpox as a child. She states the rash started 5 days ago. It is very painful with burning and tingling. Extends from her right upper back to her right breast. It is red without drainage. She denies any fever but endorses some chills. She states she went to another medical provider who gave her a shot which helped with the pain for several hours but the pain came back.  HPI  Past Medical History:  Diagnosis Date  . Depression    took meds after mother's passing  . Headache(784.0)   . Uterine fibroids affecting pregnancy     Patient Active Problem List   Diagnosis Date Noted  . Active labor at term 02/20/2015  . NVD (normal vaginal delivery) 02/20/2015  . Uterine fibroids affecting pregnancy 01/07/2012    Past Surgical History:  Procedure Laterality Date  . OVARIAN CYST REMOVAL  2009    OB History    Gravida Para Term Preterm AB Living   2 2 2  0 0 2   SAB TAB Ectopic Multiple Live Births   0 0 0 0 2       Home Medications    Prior to Admission medications   Not on File    Family History Family History  Problem Relation Age of Onset  . Heart disease Mother   . Diabetes Father   . Cancer Paternal Aunt     Social History Social History  Substance Use Topics  . Smoking status: Never Smoker  . Smokeless tobacco: Never Used  . Alcohol use No     Allergies   Patient has no known allergies.   Review of Systems Review of Systems  Constitutional: Positive for chills. Negative for fever.  Skin: Positive for color change and rash.  Allergic/Immunologic: Negative for immunocompromised state.     Physical  Exam Updated Vital Signs BP 116/70 (BP Location: Right Arm)   Pulse 77   Temp 97.9 F (36.6 C) (Oral)   Resp 20   SpO2 100%   Physical Exam  Constitutional: She is oriented to person, place, and time. She appears well-developed and well-nourished. No distress.  Spanish interpreter used  HENT:  Head: Normocephalic and atraumatic.  Eyes: Conjunctivae are normal. Pupils are equal, round, and reactive to light. Right eye exhibits no discharge. Left eye exhibits no discharge. No scleral icterus.  Neck: Normal range of motion.  Cardiovascular: Normal rate.   Pulmonary/Chest: Effort normal. No respiratory distress.  Abdominal: She exhibits no distension.  Neurological: She is alert and oriented to person, place, and time.  Skin: Skin is warm and dry. Rash noted.  Erythematous vesicular rash noted over right upper back in T4 dermatome and lateral right breast   Psychiatric: She has a normal mood and affect. Her behavior is normal.  Nursing note and vitals reviewed.    ED Treatments / Results  Labs (all labs ordered are listed, but only abnormal results are displayed) Labs Reviewed - No data to display  EKG  EKG Interpretation None       Radiology No results found.  Procedures Procedures (including critical care time)  Medications Ordered in ED Medications  valACYclovir (VALTREX) tablet 1,000 mg (not administered)  predniSONE (DELTASONE) tablet 60 mg (not administered)     Initial Impression / Assessment and Plan / ED Course  I have reviewed the triage vital signs and the nursing notes.  Pertinent labs & imaging results that were available during my care of the patient were reviewed by me and considered in my medical decision making (see chart for details).  Clinical Course    35 year old female presents with rash consistent with shingles. Patient is afebrile, not tachycardic or tachypneic, normotensive, and not hypoxic. Dose of Valtrex and steroids given in ED. Rx  given for same. Patient instructed to follow up with PCP if symptoms are not improving or return to ED if symptoms get worse. Patient is NAD, non-toxic, with stable VS. Patient is informed of clinical course, understands medical decision making process, and agrees with plan. Opportunity for questions provided and all questions answered. Return precautions given.   Final Clinical Impressions(s) / ED Diagnoses   Final diagnoses:  Herpes zoster without complication    New Prescriptions New Prescriptions   PREDNISONE (DELTASONE) 20 MG TABLET    Take 2 tablets (40 mg total) by mouth daily.   VALACYCLOVIR (VALTREX) 1000 MG TABLET    Take 1 tablet (1,000 mg total) by mouth 3 (three) times daily.     Recardo Evangelist, PA-C 05/28/16 [place], MD 05/30/16 [phone]

## 2016-05-28 NOTE — Discharge Instructions (Signed)
You were given 1 dose of Valtrex in the ED. Take 2 more doses today. You were given 1 dose of prednisone - take your next dose tomorrow

## 2016-05-28 NOTE — ED Notes (Signed)
This RN used spanish interpreter to go over d/c instructions.

## 2017-03-14 ENCOUNTER — Observation Stay (HOSPITAL_COMMUNITY)
Admission: EM | Admit: 2017-03-14 | Discharge: 2017-03-16 | Disposition: A | Discharge status: Home / Self Care | Payer: Medicaid Other | Attending: General Surgery | Admitting: General Surgery

## 2017-03-14 ENCOUNTER — Encounter (HOSPITAL_COMMUNITY): Payer: Self-pay | Admitting: Emergency Medicine

## 2017-03-14 DIAGNOSIS — K3580 Unspecified acute appendicitis: Secondary | ICD-10-CM | POA: Diagnosis not present

## 2017-03-14 DIAGNOSIS — Z809 Family history of malignant neoplasm, unspecified: Secondary | ICD-10-CM | POA: Diagnosis not present

## 2017-03-14 DIAGNOSIS — K37 Unspecified appendicitis: Secondary | ICD-10-CM | POA: Diagnosis present

## 2017-03-14 DIAGNOSIS — D259 Leiomyoma of uterus, unspecified: Secondary | ICD-10-CM | POA: Diagnosis not present

## 2017-03-14 DIAGNOSIS — K353 Acute appendicitis with localized peritonitis, without perforation or gangrene: Secondary | ICD-10-CM

## 2017-03-14 DIAGNOSIS — R1031 Right lower quadrant pain: Secondary | ICD-10-CM | POA: Diagnosis present

## 2017-03-14 DIAGNOSIS — N83201 Unspecified ovarian cyst, right side: Secondary | ICD-10-CM | POA: Diagnosis not present

## 2017-03-14 DIAGNOSIS — Z8249 Family history of ischemic heart disease and other diseases of the circulatory system: Secondary | ICD-10-CM | POA: Diagnosis not present

## 2017-03-14 DIAGNOSIS — N888 Other specified noninflammatory disorders of cervix uteri: Secondary | ICD-10-CM

## 2017-03-14 DIAGNOSIS — Z833 Family history of diabetes mellitus: Secondary | ICD-10-CM | POA: Diagnosis not present

## 2017-03-14 HISTORY — DX: Migraine, unspecified, not intractable, without status migrainosus: G43.909

## 2017-03-14 LAB — COMPREHENSIVE METABOLIC PANEL
ALK PHOS: 67 U/L (ref 38–126)
ALT: 15 U/L (ref 14–54)
ANION GAP: 8 (ref 5–15)
AST: 19 U/L (ref 15–41)
Albumin: 3.8 g/dL (ref 3.5–5.0)
BILIRUBIN TOTAL: 0.4 mg/dL (ref 0.3–1.2)
BUN: 19 mg/dL (ref 6–20)
CO2: 21 mmol/L — ABNORMAL LOW (ref 22–32)
Calcium: 8.8 mg/dL — ABNORMAL LOW (ref 8.9–10.3)
Chloride: 107 mmol/L (ref 101–111)
Creatinine, Ser: 0.97 mg/dL (ref 0.44–1.00)
GFR calc Af Amer: >60 mL/min (ref >60)
GFR calc non Af Amer: >60 mL/min (ref >60)
Glucose, Bld: 107 mg/dL — ABNORMAL HIGH (ref 65–99)
Potassium: 3.9 mmol/L (ref 3.5–5.1)
Sodium: 136 mmol/L (ref 135–145)
TOTAL PROTEIN: 6.9 g/dL (ref 6.5–8.1)

## 2017-03-14 LAB — CBC
HCT: 40.1 % (ref 36.0–46.0)
HEMOGLOBIN: 13.4 g/dL (ref 12.0–15.0)
MCH: 30.2 pg (ref 26.0–34.0)
MCHC: 33.4 g/dL (ref 30.0–36.0)
MCV: 90.5 fL (ref 78.0–100.0)
Platelets: 234 10*3/uL (ref 150–400)
RBC: 4.43 MIL/uL (ref 3.87–5.11)
RDW: 13.4 % (ref 11.5–15.5)
WBC: 8.3 10*3/uL (ref 4.0–10.5)

## 2017-03-14 LAB — LIPASE, BLOOD: Lipase: 41 U/L (ref 11–51)

## 2017-03-14 LAB — I-STAT BETA HCG BLOOD, ED (MC, WL, AP ONLY): I-stat hCG, quantitative: <5 m[IU]/mL (ref <5)

## 2017-03-14 MED ORDER — KETOROLAC TROMETHAMINE 30 MG/ML IJ SOLN
30.0000 mg | Freq: Once | INTRAMUSCULAR | Status: AC
  Administered 2017-03-15: 30 mg via INTRAVENOUS
  Filled 2017-03-14: qty 1

## 2017-03-14 NOTE — ED Triage Notes (Signed)
Pt sts RLQ abd pain x 4 days

## 2017-03-14 NOTE — ED Provider Notes (Signed)
[facility] DEPT Provider Note   CSN: [id number] Arrival date & time: 03/14/17  1824     History   Chief Complaint Chief Complaint  Patient presents with  . Abdominal Pain    HPI Caitlin Bailey is a 36 y.o. female with history of uterine fibroids, ovarian cyst removal who presents with a 22 day history of intermittent right lower quadrant pain. Patient has also had associated nausea and diarrhea. Patient's last episode of nonbloody diarrhea was yesterday. Patient denies vomiting. She also denies fever. Patient has had reproducible on palpation mild right chest pain as well. It is not pleuritic. Patient has also had dysuria associated bilateral back pain. Patient denies any shortness of breath, abnormal vaginal bleeding or discharge, concern for STD exposure. She is not taken any medications at home for her symptoms.  The history is limited by a language barrier. A language interpreter was used.    Past Medical History:  Diagnosis Date  . Depression    took meds after mother's passing  . Headache(784.0)   . Uterine fibroids affecting pregnancy     Patient Active Problem List   Diagnosis Date Noted  . Acute appendicitis 03/15/2017  . Active labor at term 02/20/2015  . NVD (normal vaginal delivery) 02/20/2015  . Uterine fibroids affecting pregnancy 01/07/2012    Past Surgical History:  Procedure Laterality Date  . OVARIAN CYST REMOVAL  2009    OB History    Gravida Para Term Preterm AB Living   2 2 2  0 0 2   SAB TAB Ectopic Multiple Live Births   0 0 0 0 2       Home Medications    Prior to Admission medications   Medication Sig Start Date End Date Taking? Authorizing Provider  predniSONE (DELTASONE) 20 MG tablet Take 2 tablets (40 mg total) by mouth daily. Patient not taking: Reported on 03/15/2017 05/28/16   Recardo Evangelist, PA-C  valACYclovir (VALTREX) 1000 MG tablet Take 1 tablet (1,000 mg total) by mouth 3 (three) times daily. Patient not taking:  Reported on 03/15/2017 05/28/16   Recardo Evangelist, PA-C    Family History Family History  Problem Relation Age of Onset  . Heart disease Mother   . Diabetes Father   . Cancer Paternal Aunt     Social History Social History  Substance Use Topics  . Smoking status: Never Smoker  . Smokeless tobacco: Never Used  . Alcohol use No     Allergies   Patient has no known allergies.   Review of Systems Review of Systems  Constitutional: Negative for chills and fever.  HENT: Negative for facial swelling and sore throat.   Respiratory: Negative for shortness of breath.   Cardiovascular: Negative for chest pain.  Gastrointestinal: Positive for abdominal pain, diarrhea and nausea. Negative for vomiting.  Genitourinary: Positive for dysuria and flank pain. Negative for vaginal bleeding, vaginal discharge and vaginal pain.  Musculoskeletal: Negative for back pain.  Skin: Negative for rash and wound.  Neurological: Negative for headaches.  Psychiatric/Behavioral: The patient is not nervous/anxious.      Physical Exam Updated Vital Signs BP (!) 97/54   Pulse 64   Temp 98.3 F (36.8 C) (Oral)   Resp 16   Ht 5' (1.524 m)   Wt 70.3 kg (155 lb)   SpO2 94%   BMI 30.27 kg/m   Physical Exam  Constitutional: She appears well-developed and well-nourished. No distress.  HENT:  Head: Normocephalic and atraumatic.  Mouth/Throat:  Oropharynx is clear and moist. No oropharyngeal exudate.  Eyes: Pupils are equal, round, and reactive to light. Conjunctivae are normal. Right eye exhibits no discharge. Left eye exhibits no discharge. No scleral icterus.  Neck: Normal range of motion. Neck supple. No thyromegaly present.  Cardiovascular: Normal rate, regular rhythm, normal heart sounds and intact distal pulses.  Exam reveals no gallop and no friction rub.   No murmur heard. Pulmonary/Chest: Effort normal and breath sounds normal. No stridor. No respiratory distress. She has no wheezes. She  has no rales.  Abdominal: Soft. Bowel sounds are normal. She exhibits no distension. There is tenderness in the right upper quadrant, right lower quadrant, periumbilical area, left upper quadrant and left lower quadrant. There is CVA tenderness and tenderness at McBurney's point. There is no rebound and no guarding.  Positive Rovsing's and positive obturator sign  Genitourinary: Cervix exhibits motion tenderness and discharge (clear). Right adnexum displays tenderness. Left adnexum displays tenderness. Vaginal discharge (clear, probably physiologic) found.  Genitourinary Comments: Chaperone present  Musculoskeletal: She exhibits no edema.  Lymphadenopathy:    She has no cervical adenopathy.  Neurological: She is alert. Coordination normal.  Skin: Skin is warm and dry. No rash noted. She is not diaphoretic. No pallor.  Psychiatric: She has a normal mood and affect.  Nursing note and vitals reviewed.    ED Treatments / Results  Labs (all labs ordered are listed, but only abnormal results are displayed) Labs Reviewed  WET PREP, GENITAL - Abnormal; Notable for the following:       Result Value   WBC, Wet Prep HPF POC MANY (*)    All other components within normal limits  COMPREHENSIVE METABOLIC PANEL - Abnormal; Notable for the following:    CO2 21 (*)    Glucose, Bld 107 (*)    Calcium 8.8 (*)    All other components within normal limits  URINALYSIS, ROUTINE W REFLEX MICROSCOPIC - Abnormal; Notable for the following:    Specific Gravity, Urine >1.046 (*)    All other components within normal limits  LIPASE, BLOOD  CBC  DIFFERENTIAL  HIV ANTIBODY (ROUTINE TESTING)  I-STAT BETA HCG BLOOD, ED ([facility], [facility], [facility] ONLY)  GC/CHLAMYDIA PROBE AMP ([place]) NOT AT [facility]    EKG  EKG Interpretation None       Radiology Ct Abdomen Pelvis W Contrast  Result Date: 03/15/2017 CLINICAL DATA:  RIGHT lower quadrant pain for 4 days. History of uterine fibroids and ovarian cystectomy. EXAM: CT  ABDOMEN AND PELVIS WITH CONTRAST TECHNIQUE: Multidetector CT imaging of the abdomen and pelvis was performed using the standard protocol following bolus administration of intravenous contrast. CONTRAST:  134mL ISOVUE-300 IOPAMIDOL (ISOVUE-300) INJECTION 61% COMPARISON:  None. FINDINGS: LOWER CHEST: Dependent atelectasis. Included heart size is normal. No pericardial effusion. HEPATOBILIARY: Liver and gallbladder are normal. PANCREAS: Normal. SPLEEN: Normal. ADRENALS/URINARY TRACT: Kidneys are orthotopic, demonstrating symmetric enhancement. No nephrolithiasis, hydronephrosis or solid renal masses. The unopacified ureters are normal in course and caliber. Urinary bladder is partially distended and unremarkable. Normal adrenal glands. STOMACH/BOWEL: The stomach, small and large bowel are normal in course and caliber without inflammatory changes. Appendix is normal in size, however appears hyperemic with focal inflammation at the appendiceal tip (coronal 29/69). No appendicolith. VASCULAR/LYMPHATIC: Aortoiliac vessels are normal in course and caliber. No lymphadenopathy by CT size criteria. REPRODUCTIVE: Multi-cystic cervical mass measuring 5.3 x 4 cm extending into the upper vaginal vault. 15 mm fluid distended endometrium. OTHER: No intraperitoneal free fluid or free  air. MUSCULOSKELETAL: Nonacute. IMPRESSION: 1. Acute tip appendicitis. 2. Multi cystic 5.3 x 4 cm cervical mass. Differential diagnosis includes tunnel cluster Nabothian cysts or, neoplasm. Recommend gynecologic consultation and, ultrasound on a nonemergent basis. Acute findings discussed with and reconfirmed by PA Pierina Schuknecht on 03/15/2017 at 2:50 am. Electronically Signed   By: Elon Alas M.D.   On: 03/15/2017 02:50    Procedures Procedures (including critical care time)  Medications Ordered in ED Medications  dextrose 5 %-0.9 % sodium chloride infusion (125 mL/hr Intravenous New Bag/Given 03/15/17 0336)  HYDROmorphone (DILAUDID)  injection 0.5 mg (not administered)  ondansetron (ZOFRAN-ODT) disintegrating tablet 4 mg (not administered)    Or  ondansetron (ZOFRAN) injection 4 mg (not administered)  ketorolac (TORADOL) 30 MG/ML injection 30 mg (30 mg Intravenous Given 03/15/17 0011)  iopamidol (ISOVUE-300) 61 % injection (100 mLs  Contrast Given 03/15/17 0143)  cefTRIAXone (ROCEPHIN) 1 g in dextrose 5 % 50 mL IVPB (0 g Intravenous Stopped 03/15/17 0455)  metroNIDAZOLE (FLAGYL) IVPB 500 mg (0 mg Intravenous Stopped 03/15/17 0455)     Initial Impression / Assessment and Plan / ED Course  I have reviewed the triage vital signs and the nursing notes.  Pertinent labs & imaging results that were available during my care of the patient were reviewed by me and considered in my medical decision making (see chart for details).     Patient with acute tip appendicitis. Patient also with mass found in the cervix on CT scan. I made patient aware she needs to see an OB/GYN and have outpatient ultrasound for further evaluation as soon as possible. I gave patient a print out for OB/GYN follow-up information considering admission for appendicitis. She understands and will follow-up. Labs unremarkable. I consulted general surgery and spoke with Dr. Rosendo Gros who will admit the patient and proceed to the OR. He advised Rocephin and Flagyl which I initiated in the ED. Patient understands and agrees with plan. Patient was stable throughout ED course.  Final Clinical Impressions(s) / ED Diagnoses   Final diagnoses:  Acute appendicitis with localized peritonitis  Mass of cervix    New Prescriptions New Prescriptions   No medications on file     Caryl Ada 03/15/17 0923    Merryl Hacker, MD 03/19/17 2300

## 2017-03-15 ENCOUNTER — Observation Stay (HOSPITAL_COMMUNITY): Payer: Medicaid Other | Admitting: Anesthesiology

## 2017-03-15 ENCOUNTER — Emergency Department (HOSPITAL_COMMUNITY): Payer: Medicaid Other

## 2017-03-15 ENCOUNTER — Encounter (HOSPITAL_COMMUNITY): Payer: Self-pay | Admitting: Anesthesiology

## 2017-03-15 ENCOUNTER — Encounter (HOSPITAL_COMMUNITY)
Admission: EM | Disposition: A | Discharge status: Home / Self Care | Payer: Self-pay | Source: Home / Self Care | Attending: Emergency Medicine

## 2017-03-15 DIAGNOSIS — Z833 Family history of diabetes mellitus: Secondary | ICD-10-CM | POA: Diagnosis not present

## 2017-03-15 DIAGNOSIS — D259 Leiomyoma of uterus, unspecified: Secondary | ICD-10-CM | POA: Diagnosis not present

## 2017-03-15 DIAGNOSIS — N83201 Unspecified ovarian cyst, right side: Secondary | ICD-10-CM | POA: Diagnosis not present

## 2017-03-15 DIAGNOSIS — Z8742 Personal history of other diseases of the female genital tract: Secondary | ICD-10-CM

## 2017-03-15 DIAGNOSIS — K37 Unspecified appendicitis: Secondary | ICD-10-CM | POA: Diagnosis present

## 2017-03-15 DIAGNOSIS — K3580 Unspecified acute appendicitis: Secondary | ICD-10-CM | POA: Diagnosis not present

## 2017-03-15 HISTORY — PX: LAPAROSCOPIC APPENDECTOMY: SHX408

## 2017-03-15 HISTORY — DX: Personal history of other diseases of the female genital tract: Z87.42

## 2017-03-15 LAB — CBC
HCT: 38.3 % (ref 36.0–46.0)
Hemoglobin: 12.7 g/dL (ref 12.0–15.0)
MCH: 30.4 pg (ref 26.0–34.0)
MCHC: 33.2 g/dL (ref 30.0–36.0)
MCV: 91.6 fL (ref 78.0–100.0)
PLATELETS: 214 10*3/uL (ref 150–400)
RBC: 4.18 MIL/uL (ref 3.87–5.11)
RDW: 13.7 % (ref 11.5–15.5)
WBC: 11.2 10*3/uL — AB (ref 4.0–10.5)

## 2017-03-15 LAB — DIFFERENTIAL
Basophils Absolute: 0 10*3/uL (ref 0.0–0.1)
Basophils Relative: 0 %
Eosinophils Absolute: 0.3 10*3/uL (ref 0.0–0.7)
Eosinophils Relative: 3 %
Lymphocytes Relative: 22 %
Lymphs Abs: 1.8 10*3/uL (ref 0.7–4.0)
Monocytes Absolute: 0.5 10*3/uL (ref 0.1–1.0)
Monocytes Relative: 6 %
Neutro Abs: 5.7 10*3/uL (ref 1.7–7.7)
Neutrophils Relative %: 69 %

## 2017-03-15 LAB — CREATININE, SERUM: Creatinine, Ser: 0.6 mg/dL (ref 0.44–1.00)

## 2017-03-15 LAB — URINALYSIS, ROUTINE W REFLEX MICROSCOPIC
Bilirubin Urine: NEGATIVE
GLUCOSE, UA: NEGATIVE mg/dL
Hgb urine dipstick: NEGATIVE
KETONES UR: NEGATIVE mg/dL
LEUKOCYTES UA: NEGATIVE
Nitrite: NEGATIVE
PH: 5 (ref 5.0–8.0)
Protein, ur: NEGATIVE mg/dL
Specific Gravity, Urine: >1.046 — ABNORMAL HIGH (ref 1.005–1.030)

## 2017-03-15 LAB — WET PREP, GENITAL
Clue Cells Wet Prep HPF POC: NONE SEEN
Sperm: NONE SEEN
Trich, Wet Prep: NONE SEEN
Yeast Wet Prep HPF POC: NONE SEEN

## 2017-03-15 LAB — GC/CHLAMYDIA PROBE AMP (~~LOC~~) NOT AT ARMC
Chlamydia: NEGATIVE
Neisseria Gonorrhea: NEGATIVE

## 2017-03-15 SURGERY — APPENDECTOMY, LAPAROSCOPIC
Anesthesia: General | Site: Abdomen

## 2017-03-15 MED ORDER — GLYCOPYRROLATE 0.2 MG/ML IJ SOLN
INTRAMUSCULAR | Status: DC | PRN
Start: 2017-03-15 — End: 2017-03-15
  Administered 2017-03-15: 0.6 mg via INTRAVENOUS

## 2017-03-15 MED ORDER — DEXTROSE-NACL 5-0.9 % IV SOLN
INTRAVENOUS | Status: DC
  Administered 2017-03-15 (×2): via INTRAVENOUS
  Administered 2017-03-15: 125 mL/h via INTRAVENOUS

## 2017-03-15 MED ORDER — FENTANYL CITRATE (PF) 100 MCG/2ML IJ SOLN
INTRAMUSCULAR | Status: DC | PRN
  Administered 2017-03-15 (×2): 50 ug via INTRAVENOUS
  Administered 2017-03-15: 100 ug via INTRAVENOUS

## 2017-03-15 MED ORDER — LACTATED RINGERS IV SOLN
INTRAVENOUS | Status: DC | PRN
  Administered 2017-03-15: 10:00:00 via INTRAVENOUS

## 2017-03-15 MED ORDER — DOCUSATE SODIUM 100 MG PO CAPS
100.0000 mg | ORAL_CAPSULE | Freq: Two times a day (BID) | ORAL | Status: DC
  Administered 2017-03-15: 100 mg via ORAL
  Filled 2017-03-15: qty 1

## 2017-03-15 MED ORDER — 0.9 % SODIUM CHLORIDE (POUR BTL) OPTIME
TOPICAL | Status: DC | PRN
  Administered 2017-03-15: 1000 mL

## 2017-03-15 MED ORDER — DEXAMETHASONE SODIUM PHOSPHATE 10 MG/ML IJ SOLN
INTRAMUSCULAR | Status: DC | PRN
  Administered 2017-03-15: 10 mg via INTRAVENOUS

## 2017-03-15 MED ORDER — ROCURONIUM BROMIDE 100 MG/10ML IV SOLN
INTRAVENOUS | Status: DC | PRN
  Administered 2017-03-15: 50 mg via INTRAVENOUS

## 2017-03-15 MED ORDER — METRONIDAZOLE IN NACL 5-0.79 MG/ML-% IV SOLN
500.0000 mg | INTRAVENOUS | Status: AC
  Administered 2017-03-15: 500 mg via INTRAVENOUS
  Filled 2017-03-15: qty 100

## 2017-03-15 MED ORDER — BUPIVACAINE-EPINEPHRINE (PF) 0.25% -1:200000 IJ SOLN
INTRAMUSCULAR | Status: AC
  Filled 2017-03-15: qty 30

## 2017-03-15 MED ORDER — ONDANSETRON HCL 4 MG/2ML IJ SOLN
INTRAMUSCULAR | Status: AC
  Administered 2017-03-15: 4 mg via INTRAVENOUS
  Filled 2017-03-15: qty 2

## 2017-03-15 MED ORDER — DEXTROSE 5 % IV SOLN
1.0000 g | Freq: Once | INTRAVENOUS | Status: AC
  Administered 2017-03-15: 1 g via INTRAVENOUS
  Filled 2017-03-15: qty 10

## 2017-03-15 MED ORDER — NEOSTIGMINE METHYLSULFATE 5 MG/5ML IV SOSY
PREFILLED_SYRINGE | INTRAVENOUS | Status: AC
  Filled 2017-03-15: qty 5

## 2017-03-15 MED ORDER — BUPIVACAINE-EPINEPHRINE 0.25% -1:200000 IJ SOLN
INTRAMUSCULAR | Status: DC | PRN
  Administered 2017-03-15: 14 mL

## 2017-03-15 MED ORDER — LIDOCAINE 2% (20 MG/ML) 5 ML SYRINGE
INTRAMUSCULAR | Status: AC
  Filled 2017-03-15: qty 5

## 2017-03-15 MED ORDER — MIDAZOLAM HCL 2 MG/2ML IJ SOLN
INTRAMUSCULAR | Status: AC
  Filled 2017-03-15: qty 2

## 2017-03-15 MED ORDER — ONDANSETRON HCL 4 MG/2ML IJ SOLN
4.0000 mg | Freq: Four times a day (QID) | INTRAMUSCULAR | Status: DC | PRN
  Administered 2017-03-15 (×3): 4 mg via INTRAVENOUS
  Filled 2017-03-15 (×2): qty 2

## 2017-03-15 MED ORDER — FENTANYL CITRATE (PF) 250 MCG/5ML IJ SOLN
INTRAMUSCULAR | Status: AC
  Filled 2017-03-15: qty 5

## 2017-03-15 MED ORDER — OXYCODONE HCL 5 MG PO TABS
5.0000 mg | ORAL_TABLET | ORAL | Status: DC | PRN
  Administered 2017-03-16: 10 mg via ORAL
  Filled 2017-03-15: qty 2

## 2017-03-15 MED ORDER — SODIUM CHLORIDE 0.9 % IR SOLN
Status: DC | PRN
  Administered 2017-03-15: 1000 mL

## 2017-03-15 MED ORDER — ONDANSETRON HCL 4 MG/2ML IJ SOLN
INTRAMUSCULAR | Status: DC | PRN
  Administered 2017-03-15: 4 mg via INTRAVENOUS

## 2017-03-15 MED ORDER — HYDROMORPHONE HCL 1 MG/ML IJ SOLN
0.5000 mg | INTRAMUSCULAR | Status: DC | PRN
  Administered 2017-03-15: 0.5 mg via INTRAVENOUS
  Filled 2017-03-15: qty 1

## 2017-03-15 MED ORDER — HYDROMORPHONE HCL 1 MG/ML IJ SOLN
INTRAMUSCULAR | Status: AC
  Administered 2017-03-15: 0.5 mg via INTRAVENOUS
  Filled 2017-03-15: qty 1

## 2017-03-15 MED ORDER — MIDAZOLAM HCL 5 MG/5ML IJ SOLN
INTRAMUSCULAR | Status: DC | PRN
  Administered 2017-03-15: 2 mg via INTRAVENOUS

## 2017-03-15 MED ORDER — ENOXAPARIN SODIUM 40 MG/0.4ML ~~LOC~~ SOLN: 40.0000 mg | SUBCUTANEOUS | Status: DC

## 2017-03-15 MED ORDER — ONDANSETRON 4 MG PO TBDP: 4.0000 mg | ORAL_TABLET | Freq: Four times a day (QID) | ORAL | Status: DC | PRN

## 2017-03-15 MED ORDER — TRAMADOL HCL 50 MG PO TABS: 50.0000 mg | ORAL_TABLET | Freq: Four times a day (QID) | ORAL | Status: DC | PRN

## 2017-03-15 MED ORDER — PHENYLEPHRINE HCL 10 MG/ML IJ SOLN
INTRAMUSCULAR | Status: DC | PRN
  Administered 2017-03-15: 120 ug via INTRAVENOUS

## 2017-03-15 MED ORDER — DEXAMETHASONE SODIUM PHOSPHATE 10 MG/ML IJ SOLN
INTRAMUSCULAR | Status: AC
  Filled 2017-03-15: qty 1

## 2017-03-15 MED ORDER — PROPOFOL 10 MG/ML IV BOLUS
INTRAVENOUS | Status: AC
  Filled 2017-03-15: qty 20

## 2017-03-15 MED ORDER — SUCCINYLCHOLINE CHLORIDE 200 MG/10ML IV SOSY
PREFILLED_SYRINGE | INTRAVENOUS | Status: AC
  Filled 2017-03-15: qty 20

## 2017-03-15 MED ORDER — ONDANSETRON HCL 4 MG/2ML IJ SOLN
INTRAMUSCULAR | Status: AC
  Filled 2017-03-15: qty 2

## 2017-03-15 MED ORDER — METRONIDAZOLE IN NACL 5-0.79 MG/ML-% IV SOLN
500.0000 mg | Freq: Once | INTRAVENOUS | Status: AC
  Administered 2017-03-15: 500 mg via INTRAVENOUS
  Filled 2017-03-15: qty 100

## 2017-03-15 MED ORDER — ACETAMINOPHEN 325 MG PO TABS
650.0000 mg | ORAL_TABLET | Freq: Four times a day (QID) | ORAL | Status: DC | PRN
  Administered 2017-03-15: 650 mg via ORAL
  Filled 2017-03-15 (×2): qty 2

## 2017-03-15 MED ORDER — HEMOSTATIC AGENTS (NO CHARGE) OPTIME
TOPICAL | Status: DC | PRN
  Administered 2017-03-15: 1 via TOPICAL

## 2017-03-15 MED ORDER — ROCURONIUM BROMIDE 10 MG/ML (PF) SYRINGE
PREFILLED_SYRINGE | INTRAVENOUS | Status: AC
  Filled 2017-03-15: qty 5

## 2017-03-15 MED ORDER — IOPAMIDOL (ISOVUE-300) INJECTION 61%
INTRAVENOUS | Status: AC
  Administered 2017-03-15: 100 mL
  Filled 2017-03-15: qty 100

## 2017-03-15 MED ORDER — METOPROLOL TARTRATE 5 MG/5ML IV SOLN: 5.0000 mg | Freq: Four times a day (QID) | INTRAVENOUS | Status: DC | PRN

## 2017-03-15 MED ORDER — NEOSTIGMINE METHYLSULFATE 10 MG/10ML IV SOLN
INTRAVENOUS | Status: DC | PRN
  Administered 2017-03-15: 3 mg via INTRAVENOUS

## 2017-03-15 MED ORDER — LIDOCAINE HCL (CARDIAC) 20 MG/ML IV SOLN
INTRAVENOUS | Status: DC | PRN
  Administered 2017-03-15: 100 mg via INTRAVENOUS

## 2017-03-15 MED ORDER — PROPOFOL 10 MG/ML IV BOLUS
INTRAVENOUS | Status: DC | PRN
  Administered 2017-03-15: 150 mg via INTRAVENOUS

## 2017-03-15 MED ORDER — HYDROMORPHONE HCL 1 MG/ML IJ SOLN
0.2500 mg | INTRAMUSCULAR | Status: DC | PRN
  Administered 2017-03-15: 0.25 mg via INTRAVENOUS

## 2017-03-15 SURGICAL SUPPLY — 41 items
APPLIER CLIP ROT 10 11.4 M/L (STAPLE)
BLADE CLIPPER SURG (BLADE) IMPLANT
CANISTER SUCT 3000ML PPV (MISCELLANEOUS) ×2 IMPLANT
CHLORAPREP W/TINT 26ML (MISCELLANEOUS) ×2 IMPLANT
CLIP APPLIE ROT 10 11.4 M/L (STAPLE) IMPLANT
COVER SURGICAL LIGHT HANDLE (MISCELLANEOUS) ×2 IMPLANT
CUTTER FLEX LINEAR 45M (STAPLE) ×2 IMPLANT
DERMABOND ADVANCED (GAUZE/BANDAGES/DRESSINGS) ×1
DERMABOND ADVANCED .7 DNX12 (GAUZE/BANDAGES/DRESSINGS) ×1 IMPLANT
DRSG TEGADERM 2-3/8X2-3/4 SM (GAUZE/BANDAGES/DRESSINGS) ×6 IMPLANT
ELECT REM PT RETURN 9FT ADLT (ELECTROSURGICAL) ×2
ELECTRODE REM PT RTRN 9FT ADLT (ELECTROSURGICAL) ×1 IMPLANT
ENDOLOOP SUT PDS II  0 18 (SUTURE)
ENDOLOOP SUT PDS II 0 18 (SUTURE) IMPLANT
GLOVE BIOGEL PI IND STRL 8 (GLOVE) ×1 IMPLANT
GLOVE BIOGEL PI INDICATOR 8 (GLOVE) ×1
GLOVE ECLIPSE 7.5 STRL STRAW (GLOVE) ×2 IMPLANT
GOWN STRL REUS W/ TWL LRG LVL3 (GOWN DISPOSABLE) ×3 IMPLANT
GOWN STRL REUS W/TWL LRG LVL3 (GOWN DISPOSABLE) ×3
HEMOSTAT SURGICEL 2X14 (HEMOSTASIS) ×2 IMPLANT
KIT BASIN OR (CUSTOM PROCEDURE TRAY) ×2 IMPLANT
KIT ROOM TURNOVER OR (KITS) ×2 IMPLANT
NS IRRIG 1000ML POUR BTL (IV SOLUTION) ×2 IMPLANT
PAD ARMBOARD 7.5X6 YLW CONV (MISCELLANEOUS) ×4 IMPLANT
POUCH SPECIMEN RETRIEVAL 10MM (ENDOMECHANICALS) ×2 IMPLANT
RELOAD 45 VASCULAR/THIN (ENDOMECHANICALS) IMPLANT
RELOAD STAPLE TA45 3.5 REG BLU (ENDOMECHANICALS) ×4 IMPLANT
SET IRRIG TUBING LAPAROSCOPIC (IRRIGATION / IRRIGATOR) ×2 IMPLANT
SHEARS HARMONIC ACE PLUS 36CM (ENDOMECHANICALS) ×2 IMPLANT
SLEEVE ENDOPATH XCEL 5M (ENDOMECHANICALS) ×2 IMPLANT
SPECIMEN JAR SMALL (MISCELLANEOUS) ×2 IMPLANT
STRIP CLOSURE SKIN 1/2X4 (GAUZE/BANDAGES/DRESSINGS) ×2 IMPLANT
SUT MNCRL AB 4-0 PS2 18 (SUTURE) ×2 IMPLANT
TOWEL OR 17X24 6PK STRL BLUE (TOWEL DISPOSABLE) ×2 IMPLANT
TOWEL OR 17X26 10 PK STRL BLUE (TOWEL DISPOSABLE) ×2 IMPLANT
TRAY FOLEY CATH SILVER 16FR (SET/KITS/TRAYS/PACK) ×2 IMPLANT
TRAY FOLEY W/METER SILVER 14FR (SET/KITS/TRAYS/PACK) ×2 IMPLANT
TRAY LAPAROSCOPIC MC (CUSTOM PROCEDURE TRAY) ×2 IMPLANT
TROCAR XCEL BLUNT TIP 100MML (ENDOMECHANICALS) ×2 IMPLANT
TROCAR XCEL NON-BLD 5MMX100MML (ENDOMECHANICALS) ×4 IMPLANT
TUBING INSUFFLATION (TUBING) ×2 IMPLANT

## 2017-03-15 NOTE — Progress Notes (Signed)
I sopoke with this patient using the [employer] Spanish interpreter.  She has had pain on and off for several weeks, but severe pain for the last 24 hours.  CT has diagnosed appendicitis of the tip of the appendix.  She is tender in the RLQ.  Will perform laparoscopic appendectomy.  Has history of ovarian cysts.  Caitlin Bailey. [name], MD, [place] [phone] [facility] Surgery

## 2017-03-15 NOTE — Transfer of Care (Signed)
Immediate Anesthesia Transfer of Care Note  Patient: Caitlin Bailey  Procedure(s) Performed: Procedure(s): APPENDECTOMY LAPAROSCOPIC (N/A)  Patient Location: PACU  Anesthesia Type:General  Level of Consciousness: awake, alert , oriented and patient cooperative  Airway & Oxygen Therapy: Patient Spontanous Breathing and Patient connected to nasal cannula oxygen  Post-op Assessment: Report given to RN and Post -op Vital signs reviewed and stable  Post vital signs: Reviewed and stable  Last Vitals:  Vitals:   03/15/17 1131 03/15/17 1145  BP: 102/66 101/69  Pulse: 86 (!) 58  Resp: 13 18  Temp:    SpO2: 96% 97%    Last Pain:  Vitals:   03/15/17 1130  TempSrc:   PainSc: Asleep         Complications: No apparent anesthesia complications

## 2017-03-15 NOTE — H&P (Signed)
Caitlin Bailey is an 36 y.o. female.   Chief Complaint: Abdominal pain HPI: Patient is a 36 year old Spanish-speaking female with a past medical history for fibroids, depression who comes in for abdominal pain. Patient states that the pain began approximately 20 days ago. States in the center of her abdomen. She states that the pain comes and goes. She states that the pain began yesterday afternoon and was unrelenting and this presented to the ER secondary to this. She states this is associated with some nausea and emesis. She she endorses anorexia.  Upon evaluation ER she underwent CT scan which was consistent with tip appendicitis. There is also noted the patient had a large cyst versus mass on her uterus. Patient denies any other vaginal bleeding.  Past Medical History:  Diagnosis Date  . Depression    took meds after mother's passing  . Headache(784.0)   . Uterine fibroids affecting pregnancy     Past Surgical History:  Procedure Laterality Date  . OVARIAN CYST REMOVAL  2009    Family History  Problem Relation Age of Onset  . Heart disease Mother   . Diabetes Father   . Cancer Paternal Aunt    Social History:  reports that she has never smoked. She has never used smokeless tobacco. She reports that she does not drink alcohol or use drugs.  Allergies: No Known Allergies   (Not in a hospital admission)  Results for orders placed or performed during the hospital encounter of 03/14/17 (from the past 48 hour(s))  Lipase, blood     Status: None   Collection Time: 03/14/17  6:56 PM  Result Value Ref Range   Lipase 41 11 - 51 U/L  Comprehensive metabolic panel     Status: Abnormal   Collection Time: 03/14/17  6:56 PM  Result Value Ref Range   Sodium 136 135 - 145 mmol/L   Potassium 3.9 3.5 - 5.1 mmol/L   Chloride 107 101 - 111 mmol/L   CO2 21 (L) 22 - 32 mmol/L   Glucose, Bld 107 (H) 65 - 99 mg/dL   BUN 19 6 - 20 mg/dL   Creatinine, Ser 0.97 0.44 - 1.00 mg/dL   Calcium 8.8  (L) 8.9 - 10.3 mg/dL   Total Protein 6.9 6.5 - 8.1 g/dL   Albumin 3.8 3.5 - 5.0 g/dL   AST 19 15 - 41 U/L   ALT 15 14 - 54 U/L   Alkaline Phosphatase 67 38 - 126 U/L   Total Bilirubin 0.4 0.3 - 1.2 mg/dL   GFR calc non Af Amer >60 >60 mL/min   GFR calc Af Amer >60 >60 mL/min    Comment: (NOTE) The eGFR has been calculated using the CKD EPI equation. This calculation has not been validated in all clinical situations. eGFR's persistently <60 mL/min signify possible Chronic Kidney Disease.    Anion gap 8 5 - 15  CBC     Status: None   Collection Time: 03/14/17  6:56 PM  Result Value Ref Range   WBC 8.3 4.0 - 10.5 K/uL   RBC 4.43 3.87 - 5.11 MIL/uL   Hemoglobin 13.4 12.0 - 15.0 g/dL   HCT 40.1 36.0 - 46.0 %   MCV 90.5 78.0 - 100.0 fL   MCH 30.2 26.0 - 34.0 pg   MCHC 33.4 30.0 - 36.0 g/dL   RDW 13.4 11.5 - 15.5 %   Platelets 234 150 - 400 K/uL  Differential     Status: None  Collection Time: 03/14/17  6:56 PM  Result Value Ref Range   Neutrophils Relative % 69 %   Neutro Abs 5.7 1.7 - 7.7 K/uL   Lymphocytes Relative 22 %   Lymphs Abs 1.8 0.7 - 4.0 K/uL   Monocytes Relative 6 %   Monocytes Absolute 0.5 0.1 - 1.0 K/uL   Eosinophils Relative 3 %   Eosinophils Absolute 0.3 0.0 - 0.7 K/uL   Basophils Relative 0 %   Basophils Absolute 0.0 0.0 - 0.1 K/uL  I-Stat beta hCG blood, ED     Status: None   Collection Time: 03/14/17  7:05 PM  Result Value Ref Range   I-stat hCG, quantitative <5.0 <5 mIU/mL   Comment 3            Comment:   GEST. AGE      CONC.  (mIU/mL)   <=1 WEEK        5 - 50     2 WEEKS       50 - 500     3 WEEKS       100 - 10,000     4 WEEKS     1,000 - 30,000        FEMALE AND NON-PREGNANT FEMALE:     LESS THAN 5 mIU/mL   Wet prep, genital     Status: Abnormal   Collection Time: 03/15/17 12:18 AM  Result Value Ref Range   Yeast Wet Prep HPF POC NONE SEEN NONE SEEN   Trich, Wet Prep NONE SEEN NONE SEEN   Clue Cells Wet Prep HPF POC NONE SEEN NONE SEEN    WBC, Wet Prep HPF POC MANY (A) NONE SEEN   Sperm NONE SEEN   Urinalysis, Routine w reflex microscopic     Status: Abnormal   Collection Time: 03/15/17  3:17 AM  Result Value Ref Range   Color, Urine YELLOW YELLOW   APPearance CLEAR CLEAR   Specific Gravity, Urine >1.046 (H) 1.005 - 1.030   pH 5.0 5.0 - 8.0   Glucose, UA NEGATIVE NEGATIVE mg/dL   Hgb urine dipstick NEGATIVE NEGATIVE   Bilirubin Urine NEGATIVE NEGATIVE   Ketones, ur NEGATIVE NEGATIVE mg/dL   Protein, ur NEGATIVE NEGATIVE mg/dL   Nitrite NEGATIVE NEGATIVE   Leukocytes, UA NEGATIVE NEGATIVE   Ct Abdomen Pelvis W Contrast  Result Date: 03/15/2017 CLINICAL DATA:  RIGHT lower quadrant pain for 4 days. History of uterine fibroids and ovarian cystectomy. EXAM: CT ABDOMEN AND PELVIS WITH CONTRAST TECHNIQUE: Multidetector CT imaging of the abdomen and pelvis was performed using the standard protocol following bolus administration of intravenous contrast. CONTRAST:  11m ISOVUE-300 IOPAMIDOL (ISOVUE-300) INJECTION 61% COMPARISON:  None. FINDINGS: LOWER CHEST: Dependent atelectasis. Included heart size is normal. No pericardial effusion. HEPATOBILIARY: Liver and gallbladder are normal. PANCREAS: Normal. SPLEEN: Normal. ADRENALS/URINARY TRACT: Kidneys are orthotopic, demonstrating symmetric enhancement. No nephrolithiasis, hydronephrosis or solid renal masses. The unopacified ureters are normal in course and caliber. Urinary bladder is partially distended and unremarkable. Normal adrenal glands. STOMACH/BOWEL: The stomach, small and large bowel are normal in course and caliber without inflammatory changes. Appendix is normal in size, however appears hyperemic with focal inflammation at the appendiceal tip (coronal 29/69). No appendicolith. VASCULAR/LYMPHATIC: Aortoiliac vessels are normal in course and caliber. No lymphadenopathy by CT size criteria. REPRODUCTIVE: Multi-cystic cervical mass measuring 5.3 x 4 cm extending into the upper vaginal  vault. 15 mm fluid distended endometrium. OTHER: No intraperitoneal free fluid or free air. MUSCULOSKELETAL: Nonacute.  IMPRESSION: 1. Acute tip appendicitis. 2. Multi cystic 5.3 x 4 cm cervical mass. Differential diagnosis includes tunnel cluster Nabothian cysts or, neoplasm. Recommend gynecologic consultation and, ultrasound on a nonemergent basis. Acute findings discussed with and reconfirmed by PA ALEXANDRA LAW on 03/15/2017 at 2:50 am. Electronically Signed   By: Elon Alas M.D.   On: 03/15/2017 02:50    Review of Systems  Constitutional: Negative for chills, fever and malaise/fatigue.  HENT: Negative for ear discharge, hearing loss and sore throat.   Eyes: Negative for blurred vision and discharge.  Respiratory: Negative for cough and shortness of breath.   Cardiovascular: Negative for chest pain, orthopnea and leg swelling.  Gastrointestinal: Positive for abdominal pain. Negative for constipation, diarrhea, heartburn, nausea and vomiting.  Musculoskeletal: Negative for myalgias and neck pain.  Skin: Negative for itching and rash.  Neurological: Negative for dizziness, focal weakness, seizures and loss of consciousness.  Endo/Heme/Allergies: Negative for environmental allergies. Does not bruise/bleed easily.  Psychiatric/Behavioral: Negative for depression and suicidal ideas.  All other systems reviewed and are negative.   Blood pressure (!) 97/54, pulse 64, temperature 98.3 F (36.8 C), temperature source Oral, resp. rate 16, height 5' (1.524 m), weight 70.3 kg (155 lb), SpO2 94 %, unknown if currently breastfeeding. Physical Exam  Constitutional: She is oriented to person, place, and time. Vital signs are normal. She appears well-developed and well-nourished.  Conversant No acute distress  HENT:  Head: Normocephalic and atraumatic.  Eyes: Pupils are equal, round, and reactive to light. Conjunctivae, EOM and lids are normal. No scleral icterus.  No lid lag Moist conjunctiva   Neck: Normal range of motion. Neck supple. No tracheal tenderness present. No tracheal deviation present. No thyromegaly present.  No cervical lymphadenopathy  Cardiovascular: Normal rate, regular rhythm and intact distal pulses.   No murmur heard. Respiratory: Effort normal and breath sounds normal. She has no wheezes. She has no rales.  GI: Soft. There is no hepatosplenomegaly. There is tenderness. There is tenderness at McBurney's point. There is no rigidity, no rebound and no guarding. No hernia.  Neurological: She is alert and oriented to person, place, and time.  Normal gait and station  Skin: Skin is warm. No rash noted. No cyanosis. Nails show no clubbing.  Normal skin turgor  Psychiatric: Judgment normal.  Appropriate affect     Assessment/Plan 36 y/o F with tip appendicitis and likely fibroid of the uterus  1. We will admit her, nothing by mouth, IV antibiotics. 2. Patient will require lap appendectomy. I'll discuss this with Dr. Hulen Skains.  Reyes Ivan, MD 03/15/2017, 6:41 AM

## 2017-03-15 NOTE — Op Note (Signed)
OPERATIVE REPORT  DATE OF OPERATION:  03/15/2017  PATIENT:  Caitlin Bailey  36 y.o. female  PRE-OPERATIVE DIAGNOSIS:  Acute appendicitis  POST-OPERATIVE DIAGNOSIS:  Possible early acute appendicitis, hemorrhagic ruptured right ovarian cyst  INDICATION(S) FOR OPERATION:  Patient with severe right lower quadrant pain and tenderness.  History of ovarian cysts.  CT scan demonstrated what appeared to be tip appendicitis  FINDINGS:  Ruptured, hemorrhagic right ovarian cyst.  Mildly injected serosal of appendix without suppurative changes  PROCEDURE:  Procedure(s): APPENDECTOMY LAPAROSCOPIC PARTIAL OOPHORECTOMY  SURGEON:  Surgeon(s): Judeth Horn, MD  ASSISTANT: Rayburn, PA-C  ANESTHESIA:   general  COMPLICATIONS:  None  EBL: 25 ml  BLOOD ADMINISTERED: none  DRAINS: none   SPECIMEN:  Source of Specimen:  Partial resection of right ovary.  Appendix  COUNTS CORRECT:  YES  PROCEDURE DETAILS: The patient was taken to the operating room and placed on the table in supine position. After an adequate general endotracheal anesthetic was administered, she was prepped and draped in the usual sterile manner exposing her abdomen.  A proper timeout was performed identifying the patient and procedure to be performed.  A supraumbilical midline incision was made using a #15 blade and taken down to the midline fascia. We incised the midline fascia then bluntly dissected down into the peritoneal cavity with a Kelly clamp. A pursestring suture of 0 Vicryl was passed around the fascial opening then a Hassan cannula pass into the peritoneal cavity. We insufflated carbon dioxide gas up to a maximal intra-abdominal pressure of 15 mmHg. We then passed to 5 mm cannulas one in the right upper quadrant and one in the left lower quadrant under direct vision.  The patient was placed in Trendelenburg and left side was tilted down. The patient was noted to have some bloody fluid in the pelvic area and upon  inspecting that area she had a partially ruptured and hemorrhagic ovarian cyst. The clot of this cyst was expressed out with pressure, and because of some of the oozing we used a blue cartridge Endo GIA to come across the distal portion of the ovary to control the bleeding and sent off a small specimen.  We then inspected the appendix which appeared to be mostly normal but slightly injected on the serosal surface. We dissected it free using a Harmonic Scalpel taking the mesoappendix and then subsequently come across the base of the appendix using a blue cartridge Endo GIA. We retrieved the appendix from the supraumbilical cannula site using an Endo Catch bag.  We inspected the partial resected right ovary and the periappendiceal area for bleeding and none was noted. We irrigated with less than a liter saline solution and then we aspirated all fluid and gas from above the liver. We closed off the fascial side at the super umbilical site using the pursestring suture which was in place. We then removed all cannulas.  We injected 0.25% Marcaine and all sites. The skin was closed using running subcuticular stitch of 4-0 Monocryl. Dermabond Steri-Strips and Tegaderm used to complete our dressings. All needle counts, sponge counts, and instrument counts were correct.  PATIENT DISPOSITION:  PACU - hemodynamically stable.     Rupture hemorrhagic  Clot evacuated Stapled cyst Cyst       Caitlin Bailey 8/29/201811:12 AM

## 2017-03-15 NOTE — Anesthesia Preprocedure Evaluation (Addendum)
Anesthesia Evaluation  Patient identified by MRN, date of birth, ID band Patient awake    Reviewed: Allergy & Precautions, H&P , Patient's Chart, lab work & pertinent test results, reviewed documented beta blocker date and time   Airway Mallampati: II  TM Distance: >3 FB Neck ROM: full    Dental no notable dental hx.    Pulmonary    Pulmonary exam normal breath sounds clear to auscultation       Cardiovascular  Rhythm:regular Rate:Normal     Neuro/Psych    GI/Hepatic   Endo/Other    Renal/GU      Musculoskeletal   Abdominal   Peds  Hematology   Anesthesia Other Findings   Reproductive/Obstetrics                             Anesthesia Physical Anesthesia Plan  ASA: I and emergent  Anesthesia Plan: General   Post-op Pain Management:    Induction: Intravenous and Rapid sequence  PONV Risk Score and Plan: 2 and Ondansetron and Dexamethasone  Airway Management Planned: Oral ETT  Additional Equipment:   Intra-op Plan:   Post-operative Plan: Extubation in OR  Informed Consent: I have reviewed the patients History and Physical, chart, labs and discussed the procedure including the risks, benefits and alternatives for the proposed anesthesia with the patient or authorized representative who has indicated his/her understanding and acceptance.   Dental Advisory Given  Plan Discussed with: CRNA and Surgeon  Anesthesia Plan Comments: (  )        Anesthesia Quick Evaluation

## 2017-03-16 ENCOUNTER — Encounter (HOSPITAL_COMMUNITY): Payer: Self-pay | Admitting: General Surgery

## 2017-03-16 LAB — CBC
HEMATOCRIT: 38.6 % (ref 36.0–46.0)
HEMOGLOBIN: 12.4 g/dL (ref 12.0–15.0)
MCH: 29.5 pg (ref 26.0–34.0)
MCHC: 32.1 g/dL (ref 30.0–36.0)
MCV: 91.7 fL (ref 78.0–100.0)
Platelets: 252 10*3/uL (ref 150–400)
RBC: 4.21 MIL/uL (ref 3.87–5.11)
RDW: 13.8 % (ref 11.5–15.5)
WBC: 9.9 10*3/uL (ref 4.0–10.5)

## 2017-03-16 MED ORDER — IBUPROFEN 200 MG PO TABS: ORAL_TABLET | ORAL | Status: DC

## 2017-03-16 MED ORDER — IBUPROFEN 600 MG PO TABS: 600.0000 mg | ORAL_TABLET | Freq: Four times a day (QID) | ORAL | Status: DC | PRN

## 2017-03-16 MED ORDER — ACETAMINOPHEN 325 MG PO TABS: 650.0000 mg | ORAL_TABLET | Freq: Four times a day (QID) | ORAL | Status: DC | PRN

## 2017-03-16 MED ORDER — OXYCODONE HCL 5 MG PO TABS: 5.0000 mg | ORAL_TABLET | ORAL | 0 refills | Status: DC | PRN

## 2017-03-16 NOTE — Progress Notes (Signed)
Interpreter Lesle Chris for RN

## 2017-03-16 NOTE — Care Management Note (Signed)
Case Management Note  Patient Details  Name: Caitlin Bailey MRN: [id number] Date of Birth: 1981-02-22  Subjective/Objective:                    Action/Plan:  Via interpreter Lesle Chris Expected Discharge Date:  03/16/17               Expected Discharge Plan:  Home/Self Care  In-House Referral:  Financial Counselor  Discharge planning Services  CM Consult, [facility], Medication Assistance, [facility]  Post Acute Care Choice:    Choice offered to:  Patient  DME Arranged:    DME Agency:     HH Arranged:    [place] Agency:     Status of Service:  Completed, signed off  If discussed at [employer], dates discussed:    Additional Comments:  Marilu Favre, RN 03/16/2017, 12:10 PM

## 2017-03-16 NOTE — Anesthesia Postprocedure Evaluation (Signed)
Anesthesia Post Note  Patient: Caitlin Bailey  Procedure(s) Performed: Procedure(s) (LRB): APPENDECTOMY LAPAROSCOPIC (N/A)     Patient location during evaluation: PACU Anesthesia Type: General Level of consciousness: awake and alert Pain management: pain level controlled Vital Signs Assessment: post-procedure vital signs reviewed and stable Respiratory status: spontaneous breathing, nonlabored ventilation, respiratory function stable and patient connected to nasal cannula oxygen Cardiovascular status: blood pressure returned to baseline and stable Postop Assessment: no signs of nausea or vomiting Anesthetic complications: no    Last Vitals:  Vitals:   03/16/17 0236 03/16/17 0641  BP: 102/60 (!) 101/59  Pulse: 70 74  Resp: 17 17  Temp: 36.8 C 36.7 C  SpO2: 99% 98%    Last Pain:  Vitals:   03/16/17 0236  TempSrc: Oral  PainSc:                  Riccardo Dubin

## 2017-03-16 NOTE — Discharge Instructions (Signed)
Laparoscopic Appendectomy, Adult, Care After °Refer to this sheet in the next few weeks. These instructions provide you with information about caring for yourself after your procedure. Your health care provider may also give you more specific instructions. Your treatment has been planned according to current medical practices, but problems sometimes occur. Call your health care provider if you have any problems or questions after your procedure. °What can I expect after the procedure? °After the procedure, it is common to have: °· A decrease in your energy level. °· Mild pain in the area where the surgical cuts (incisions) were made. °· Constipation. This can be caused by pain medicine and a decrease in your activity. ° °Follow these instructions at home: °Medicines °· Take over-the-counter and prescription medicines only as told by your health care provider. °· Do not drive for 24 hours if you received a sedative. °· Do not drive or operate heavy machinery while taking prescription pain medicine. °· If you were prescribed an antibiotic medicine, take it as told by your health care provider. Do not stop taking the antibiotic even if you start to feel better. °Activity °· For 3 weeks or as long as told by your health care provider: °? Do not lift anything that is heavier than 10 pounds (4.5 kg). °? Do not play contact sports. °· Gradually return to your normal activities. Ask your health care provider what activities are safe for you. °Bathing °· Keep your incisions clean and dry. Clean them as often as told by your health care provider: °? Gently wash the incisions with soap and water. °? Rinse the incisions with water to remove all soap. °? Pat the incisions dry with a clean towel. Do not rub the incisions. °· You may take showers after 48 hours. °· Do not take baths, swim, or use hot tubs for 2 weeks or as told by your health care provider. °Incision care °· Follow instructions from your healthcare provider about  how to take care of your incisions. Make sure you: °? Wash your hands with soap and water before you change your bandage (dressing). If soap and water are not available, use hand sanitizer. °? Change your dressing as told by your health care provider. °? Leave stitches (sutures), skin glue, or adhesive strips in place. These skin closures may need to stay in place for 2 weeks or longer. If adhesive strip edges start to loosen and curl up, you may trim the loose edges. Do not remove adhesive strips completely unless your health care provider tells you to do that. °· Check your incision areas every day for signs of infection. Check for: °? More redness, swelling, or pain. °? More fluid or blood. °? Warmth. °? Pus or a bad smell. °Other Instructions °· If you were sent home with a drain, follow instructions from your health care provider about how to care for the drain and how to empty it. °· Take deep breaths. This helps to prevent your lungs from becoming inflamed. °· To relieve and prevent constipation: °? Drink plenty of fluids. °? Eat plenty of fruits and vegetables. °· Keep all follow-up visits as told by your health care provider. This is important. °Contact a health care provider if: °· You have more redness, swelling, or pain around an incision. °· You have more fluid or blood coming from an incision. °· Your incision feels warm to the touch. °· You have pus or a bad smell coming from an incision or dressing. °· Your incision   edges break open after your sutures have been removed.  You have increasing pain in your shoulders.  You feel dizzy or you faint.  You develop shortness of breath.  You keep feeling nauseous or vomiting.  You have diarrhea or you cannot control your bowel functions.  You lose your appetite.  You develop swelling or pain in your legs. Get help right away if:  You have a fever.  You develop a rash.  You have difficulty breathing.  You have sharp pains in your  chest. This information is not intended to replace advice given to you by your health care provider. Make sure you discuss any questions you have with your health care provider. Document Released: 07/04/2005 Document Revised: 12/04/2015 Document Reviewed: 12/22/2014 Elsevier Interactive Patient Education  2017 [place] ______[facility] LAPAROSCOPIC SURGERY: POST OP INSTRUCTIONS Always review your discharge instruction sheet given to you by the facility where your surgery was performed. IF YOU HAVE DISABILITY OR FAMILY LEAVE FORMS, YOU MUST BRING THEM TO THE OFFICE FOR PROCESSING.   DO NOT GIVE THEM TO YOUR DOCTOR.  1. A prescription for pain medication may be given to you upon discharge.  Take your pain medication as prescribed, if needed.  If narcotic pain medicine is not needed, then you may take acetaminophen (Tylenol) or ibuprofen (Advil) as needed. 2. Take your usually prescribed medications unless otherwise directed. 3. If you need a refill on your pain medication, please contact your pharmacy.  They will contact our office to request authorization. Prescriptions will not be filled after 5pm or on week-ends. 4. You should follow a light diet the first few days after arrival home, such as soup and crackers, etc.  Be sure to include lots of fluids daily. 5. Most patients will experience some swelling and bruising in the area of the incisions.  Ice packs will help.  Swelling and bruising can take several days to resolve.  6. It is common to experience some constipation if taking pain medication after surgery.  Increasing fluid intake and taking a stool softener (such as Colace) will usually help or prevent this problem from occurring.  A mild laxative (Milk of Magnesia or Miralax) should be taken according to package instructions if there are no bowel movements after 48 hours. 7. Unless discharge instructions indicate otherwise, you may remove your bandages 24-48  hours after surgery, and you may shower at that time.  You may have steri-strips (small skin tapes) in place directly over the incision.  These strips should be left on the skin for 7-10 days.  If your surgeon used skin glue on the incision, you may shower in 24 hours.  The glue will flake off over the next 2-3 weeks.  Any sutures or staples will be removed at the office during your follow-up visit. 8. ACTIVITIES:  You may resume regular (light) daily activities beginning the next day--such as daily self-care, walking, climbing stairs--gradually increasing activities as tolerated.  You may have sexual intercourse when it is comfortable.  Refrain from any heavy lifting or straining until approved by your doctor. a. You may drive when you are no longer taking prescription pain medication, you can comfortably wear a seatbelt, and you can safely maneuver your car and apply brakes. b. RETURN TO WORK:  __________________________________________________________ 9. You should see your doctor in the office for a follow-up appointment approximately 2-3 weeks after your surgery.  Make sure that you call for this appointment within a day or  two after you arrive home to insure a convenient appointment time. 10. OTHER INSTRUCTIONS: __________________________________________________________________________________________________________________________ __________________________________________________________________________________________________________________________ WHEN TO CALL YOUR DOCTOR: 1. Fever over 101.0 2. Inability to urinate 3. Continued bleeding from incision. 4. Increased pain, redness, or drainage from the incision. 5. Increasing abdominal pain  The clinic staff is available to answer your questions during regular business hours.  Please dont hesitate to call and ask to speak to one of the nurses for clinical concerns.  If you have a medical emergency, go to the nearest emergency room or call  911.  A surgeon from [facility] is always on call at the hospital. [address], [place], [place], [place]  [address] ? [address], [place], [place]   [address] [phone] ? [phone] ? FAX [phone] Web site: [email or web address]   Laparotoma exploratoria en adultos, cuidados posteriores (Exploratory Laparotomy, Adult, Care After) Siga estas instrucciones durante las prximas semanas. Estas indicaciones le proporcionan informacin acerca de cmo deber cuidarse despus del procedimiento. El mdico tambin podr darle instrucciones ms especficas. El tratamiento ha sido planificado segn las prcticas mdicas actuales, pero en algunos casos pueden ocurrir problemas. Comunquese con el mdico si tiene algn problema o tiene dudas despus del procedimiento. QU ESPERAR DESPUS DEL PROCEDIMIENTO Despus del procedimiento, es normal tener los siguientes sntomas:  Dolor abdominal.  Fatiga.  Dolor de garganta debido a las cnulas.  Falta de apetito. [place] los medicamentos solamente como se lo haya indicado el mdico.  No conduzca ni opere maquinaria pesada mientras toma analgsicos. Cuidado de la incisin  Hay muchas maneras distintas de cerrar y [place] incisin, entre ellas, puntos (suturas), pegamento cutneo y tiras Cash. Siga las instrucciones del mdico con respecto a lo siguiente: ? Careers adviser herida. ? Cambiar y Paramedic vendaje. ? Quitar el cierre de la incisin.  No se duche ni tome baos de inmersin hasta que el mdico lo autorice.  Datto zona de la incisin para detectar signos de infeccin. Est atento a lo siguiente: ? Enrojecimiento. ? Dolor a Secretary/administrator. ? Hinchazn. ? Secrecin. Actividades  No levante objetos que pesen ms de 10libras (4,5kg) hasta que el mdico le diga que es seguro [place].  Intente caminar un [place],  si el mdico lo autoriza.  Pregntele al mdico cundo puede retomar sus actividades normales, por ejemplo, Forensic psychologist, regresar a Fish farm manager y Clinical biochemist. Comida y bebida  Puede seguir su dieta habitual. Riley Nearing en su dieta gran cantidad de cereales integrales, frutas y verduras. Esto ayudar a Contractor.  Beba suficiente lquido para Consulting civil engineer orina clara o de color amarillo plido. Instrucciones generales  Concurra a todas las visitas de control como se lo haya indicado el mdico. Esto es importante. SOLICITE ATENCIN MDICA SI:  Jaclynn Guarneri.  Tiene escalofros.  El medicamento no Production designer, theatre/television/film.  Sufre estreimiento o diarrea.  Tiene nuseas o vmitos.  Hay secrecin, enrojecimiento, hinchazn o dolor en el lugar de la incisin. SOLICITE ATENCIN MDICA DE INMEDIATO SI:  El dolor empeora.  Han pasado ms de 3das desde que pudo defecar.  Tiene vmitos permanentes (persistentes).  Los bordes de la incisin se abren.  Siente calor y dolor a la palpacin en la pantorrilla, y la tiene hinchada.  Tiene dificultad para respirar.  Siente dolor en el pecho. Esta informacin no tiene Marine scientist el consejo del mdico. Asegrese de hacerle al  mdico cualquier pregunta que tenga. Document Released: 06/20/2012 Document Revised: 07/25/2014 Document Reviewed: 02/19/2014 Elsevier Interactive Patient Education  2018 [employer].

## 2017-03-16 NOTE — Progress Notes (Signed)
1 Day Post-Op    YI:FOYDXAJOI pain  Subjective: Patient had nausea and vomiting last night around 8 PM she has not eaten since. Her port sites look good she is ambulating voiding without difficulty.  Objective: Vital signs in last 24 hours: Temp:  [97.9 F (36.6 C)-98.7 F (37.1 C)] 98.1 F (36.7 C) (08/30 0641) Pulse Rate:  [56-86] 74 (08/30 0641) Resp:  [12-27] 17 (08/30 0641) BP: (94-114)/(49-76) 101/59 (08/30 0641) SpO2:  [96 %-100 %] 98 % (08/30 0641) Weight:  [67.4 kg (148 lb 11.2 oz)] 67.4 kg (148 lb 11.2 oz) (08/29 1234) Last BM Date: 03/15/17 220 PO  3300 IV 610 urine NG /emesis 100 afebrile VSS CBC stable  Intake/Output from previous day: 08/29 0701 - 08/30 0700 In: 3517.8 [P.O.:220; I.V.:3297.8] Out: 750 [Urine:610; Emesis/NG output:100; Blood:40] Intake/Output this shift: No intake/output data recorded.  General appearance: alert, cooperative and no distress GI: soft, sore, sites all look good.  Lab Results:   Recent Labs  03/15/17 1436 03/16/17 0313  WBC 11.2* 9.9  HGB 12.7 12.4  HCT 38.3 38.6  PLT 214 252    BMET  Recent Labs  03/14/17 1856 03/15/17 1436  NA 136  --   K 3.9  --   CL 107  --   CO2 21*  --   GLUCOSE 107*  --   BUN 19  --   CREATININE 0.97 0.60  CALCIUM 8.8*  --    PT/INR No results for input(s): LABPROT, INR in the last 72 hours.   Recent Labs Lab 03/14/17 1856  AST 19  ALT 15  ALKPHOS 67  BILITOT 0.4  PROT 6.9  ALBUMIN 3.8     Lipase     Component Value Date/Time   LIPASE 41 03/14/2017 1856     Prior to Admission medications   Medication Sig Start Date End Date Taking? Authorizing Provider  predniSONE (DELTASONE) 20 MG tablet Take 2 tablets (40 mg total) by mouth daily. Patient not taking: Reported on 03/15/2017 05/28/16   Recardo Evangelist, PA-C  valACYclovir (VALTREX) 1000 MG tablet Take 1 tablet (1,000 mg total) by mouth 3 (three) times daily. Patient not taking: Reported on 03/15/2017 05/28/16    Recardo Evangelist, PA-C    Medications: . docusate sodium  100 mg Oral BID  . enoxaparin (LOVENOX) injection  40 mg Subcutaneous Q24H   . dextrose 5 % and 0.9% NaCl 125 mL/hr at 03/15/17 2117    Assessment/Plan Early appendicitis with hemorrhagic ruptured right ovarian cyst Laparoscopic appendectomy, partial oophorectomy - 03/15/17, Dr. Judeth Horn Hx of depression Hx of uterine fibroids Hx of headache FEN:  IV fluids/regular diet DVT:  Lovenox   plan: Mobilize, advance diet, see how she does on oral by mouth pain medicine. She does well we'll aim to discharge her later today. 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 We will give her a note to that effect. She was concerned that a family member also had a cyst that turned out to be cancerous. I told her they would have the pathology report when she returns to the office for follow-up. Intubated [id number] was used for this discussion. I have personally reviewed the patients medication history on the [place] controlled substance database.   LOS: 0 days    Caitlin Bailey 03/16/2017 [phone]

## 2017-03-22 NOTE — Discharge Summary (Signed)
Physician Discharge Summary  Patient ID: Caitlin Bailey MRN: [id number] DOB/AGE: 1981-07-03 36 y.o.  Admit date: 03/14/2017 Discharge date: 03/16/17  Admission Diagnoses:  Abdominal pain with possible appendicitis Discharge Diagnoses:  Early appendicitis with hemorrhagic ruptured right ovarian cyst  Hx of depression Hx of uterine fibroids Hx of headache  Active Problems:   Acute appendicitis   Appendicitis   PROCEDURES: Laparoscopic appendectomy, partial oophorectomy - 03/15/17, Dr. Vergia Alberts Course:  Chief Complaint: Abdominal pain HPI: Patient is a 36 year old Spanish-speaking female with a past medical history for fibroids, depression who comes in for abdominal pain. Patient states that the pain began approximately 20 days ago. States in the center of her abdomen. She states that the pain comes and goes. She states that the pain began yesterday afternoon and was unrelenting and this presented to the ER secondary to this. She states this is associated with some nausea and emesis. She she endorses anorexia. Upon evaluation ER she underwent CT scan which was consistent with tip appendicitis. There is also noted the patient had a large cyst versus mass on her uterus. Patient denies any other vaginal bleeding.  She was seen in ghe early AM by Dr. Derrell Lolling.  She was seen and taken to the OR in the AM, that morning by Dr. Lindie Spruce.  She tolerated the procedure well.  She was mobilized and her diet was advanced.  She was discharged after it was shown she could eat, drink, void, and tolerate PO pain meds well.    Condition on D/C:  Improved  CBC Latest Ref Rng & Units 03/16/2017 03/15/2017 03/14/2017  WBC 4.0 - 10.5 K/uL 9.9 11.2(H) 8.3  Hemoglobin 12.0 - 15.0 g/dL 32.9 51.8 84.1  Hematocrit 36.0 - 46.0 % 38.6 38.3 40.1  Platelets 150 - 400 K/uL 252 214 234   CMP Latest Ref Rng & Units 03/15/2017 03/14/2017 05/29/2014  Glucose 65 - 99 mg/dL - 660(Y) 301(S)  BUN 6 - 20 mg/dL - 19 18   Creatinine 0.10 - 1.00 mg/dL 9.32 3.55 7.32  Sodium 135 - 145 mmol/L - 136 139  Potassium 3.5 - 5.1 mmol/L - 3.9 3.8  Chloride 101 - 111 mmol/L - 107 104  CO2 22 - 32 mmol/L - 21(L) 21  Calcium 8.9 - 10.3 mg/dL - 8.8(L) 8.9  Total Protein 6.5 - 8.1 g/dL - 6.9 -  Total Bilirubin 0.3 - 1.2 mg/dL - 0.4 -  Alkaline Phos 38 - 126 U/L - 67 -  AST 15 - 41 U/L - 19 -  ALT 14 - 54 U/L - 15 -    Pathology: 1. Appendix, Other than Incidental - APPENDIX WITH FIBROUS OBLITERATION OF THE TIP AND MILD INFLAMMATION. - APPENDICEAL EPIPLOICA. - THERE IS NO EVIDENCE OF MALIGNANCY. 2. Ovary, cyst, Partial Excision of right ruptured Hemorrhagic ovarian cyst - BENIGN OVARIAN PARENCHYMA WITH ASSOCIATED HEMORRHAGIC CYST. - THERE IS NO EVIDENCE OF MALIGNANCY.  Condition on d/c:  Improved    Disposition: 01-Home or Self Care   Allergies as of 03/16/2017   No Known Allergies     Medication List    STOP taking these medications   predniSONE 20 MG tablet Commonly known as:  DELTASONE   valACYclovir 1000 MG tablet Commonly known as:  VALTREX     TAKE these medications   acetaminophen 325 MG tablet Commonly known as:  TYLENOL Take 2 tablets (650 mg total) by mouth every 6 (six) hours as needed for mild pain or fever.  ibuprofen 200 MG tablet Commonly known as:  ADVIL,MOTRIN You can take 2 or 3 tablets every 6 hours as needed for pain. You can alternate this with plain Tylenol or the oxycodone. You can purchase this over-the-counter at any drugstore.   oxyCODONE 5 MG immediate release tablet Commonly known as:  Oxy IR/ROXICODONE Take 1-2 tablets (5-10 mg total) by mouth every 4 (four) hours as needed for moderate pain.            Discharge Care Instructions        Start     Ordered   03/16/17 0000  acetaminophen (TYLENOL) 325 MG tablet  Every 6 hours PRN    Question:  Supervising Provider  Answer:  Jimmye Norman   03/16/17 1610   03/16/17 0000  ibuprofen (ADVIL,MOTRIN) 200 MG  tablet    Question:  Supervising Provider  Answer:  Jimmye Norman   03/16/17 0808   03/16/17 0000  oxyCODONE (OXY IR/ROXICODONE) 5 MG immediate release tablet  Every 4 hours PRN    Question:  Supervising Provider  Answer:  Jimmye Norman   03/16/17 0808     Follow-up Information    Schedule an appointment as soon as possible for a visit with [facility].   Why:  For further evaluation and treatment of the mass found on your cervix today Contact information: [address] [place] [address] [phone]       [facility], [place] Follow up on 03/30/2017.   Specialty:  General Surgery Why:  Your appointment is at:  1:30 PM, be at the office 30 minutes early for check in.  Bring photo ID and insurance information.    Contact information: [address] [place] [address] [phone]        [facility]. Schedule an appointment as soon as possible for a visit.   Contact information: [address] [place] [address] [phone]          Signed: Sherrie George 03/22/2017, 2:50 PM

## 2017-03-27 ENCOUNTER — Ambulatory Visit: Payer: Self-pay | Attending: Internal Medicine | Admitting: Physician Assistant

## 2017-03-27 ENCOUNTER — Ambulatory Visit: Payer: Self-pay

## 2017-03-27 VITALS — BP 107/75 | HR 76 | Temp 98.4°F | Ht 63.0 in | Wt 135.8 lb

## 2017-03-27 DIAGNOSIS — Z809 Family history of malignant neoplasm, unspecified: Secondary | ICD-10-CM | POA: Insufficient documentation

## 2017-03-27 DIAGNOSIS — G43009 Migraine without aura, not intractable, without status migrainosus: Secondary | ICD-10-CM

## 2017-03-27 DIAGNOSIS — G43909 Migraine, unspecified, not intractable, without status migrainosus: Secondary | ICD-10-CM | POA: Insufficient documentation

## 2017-03-27 DIAGNOSIS — Z79899 Other long term (current) drug therapy: Secondary | ICD-10-CM | POA: Insufficient documentation

## 2017-03-27 DIAGNOSIS — Z8249 Family history of ischemic heart disease and other diseases of the circulatory system: Secondary | ICD-10-CM | POA: Insufficient documentation

## 2017-03-27 DIAGNOSIS — N83201 Unspecified ovarian cyst, right side: Secondary | ICD-10-CM

## 2017-03-27 DIAGNOSIS — Z79891 Long term (current) use of opiate analgesic: Secondary | ICD-10-CM | POA: Insufficient documentation

## 2017-03-27 DIAGNOSIS — K37 Unspecified appendicitis: Secondary | ICD-10-CM | POA: Insufficient documentation

## 2017-03-27 DIAGNOSIS — Z833 Family history of diabetes mellitus: Secondary | ICD-10-CM | POA: Insufficient documentation

## 2017-03-27 DIAGNOSIS — Z9889 Other specified postprocedural states: Secondary | ICD-10-CM | POA: Insufficient documentation

## 2017-03-27 MED ORDER — TRAMADOL HCL 50 MG PO TABS: 50.0000 mg | ORAL_TABLET | Freq: Three times a day (TID) | ORAL | 0 refills | Status: DC | PRN

## 2017-03-27 MED ORDER — SUMATRIPTAN SUCCINATE 25 MG PO TABS: 25.0000 mg | ORAL_TABLET | ORAL | 0 refills | Status: DC | PRN

## 2017-03-27 NOTE — Progress Notes (Signed)
[name]  ZOX:[id number]  [id number]:[id number]  DOB - 03-Jun-1981  Chief Complaint  Patient presents with  . Hospitalization Follow-up       Subjective:   Caitlin Bailey is a 36 y.o. female here today for establishment of care. She has a history of uterine fibroids, cysts, depression and migraine headaches. Has been several years since he was routinely seen by a medical provider. She went to the emergency department on 03/14/2017 with complaints of central/right lower quadrant abdominal pain for weeks. She started expense and nausea, diarrhea and vomiting which led her to go for evaluation. Never any fevers or chills. Her vital signs were stable upon presentation but her abdominal exam was very abnormal. Her white blood cell count was normal at 8.3. A CT scan showed suspected tip appendicitis and she was noted to have a large cyst versus mass on the uterus. General surgery admitted her and she underwent laparoscopic appendectomy on 03/15/2017. She was found to have a ruptured, hemorrhagic right ovarian cyst at that time as well, the bleeding was controlled. No evidence of cancer. She was discharged home on 03/16/2017. At that time her CBC was within normal limits.  She states that she was given some pain medications (oxycodone) that made her feel dizzy, flushed and nauseated. She only took 2 doses of this medication. Since then she's been taking Tylenol with intermittent improvement in her pain control. She has a follow-up with general surgery on September 13.  No nausea or vomiting. No fevers or chills. Still with diffuse pain in the abdominal area more so on the right.  She further states she's been having more migraines lately and does not have routine maintenance medications.  ROS: GEN: denies fever or chills, denies change in weight Skin: denies lesions or rashes HEENT:+ headache, no earache, epistaxis, sore throat, or neck pain LUNGS: denies SHOB, dyspnea, PND, orthopnea CV: denies CP  or palpitations ABD: + abd pain, no N or V EXT: denies muscle spasms or swelling; no pain in lower ext, no weakness NEURO: denies numbness or tingling, denies sz, stroke or TIA  ALLERGIES: No Known Allergies  PAST MEDICAL HISTORY: Past Medical History:  Diagnosis Date  . Depression    took meds after mother's passing  . Family history of adverse reaction to anesthesia    hyperthermia; pt denies knowledge of this on 03/15/2017  . Migraine    "too offen when I get too stressed" (03/15/2017)  . Uterine fibroids affecting pregnancy     PAST SURGICAL HISTORY: Past Surgical History:  Procedure Laterality Date  . APPENDECTOMY  03/15/2017  . CYST EXCISION Left 2000s   "groin"  . LAPAROSCOPIC APPENDECTOMY N/A 03/15/2017   Procedure: APPENDECTOMY LAPAROSCOPIC;  Surgeon: Jimmye Norman, MD;  Location: [facility];  Service: General;  Laterality: N/A;  . OVARIAN CYST REMOVAL  2009    MEDICATIONS AT HOME: Prior to Admission medications   Medication Sig Start Date End Date Taking? Authorizing Provider  acetaminophen (TYLENOL) 325 MG tablet Take 2 tablets (650 mg total) by mouth every 6 (six) hours as needed for mild pain or fever. 03/16/17  Yes Sherrie George, PA-C  ibuprofen (ADVIL,MOTRIN) 200 MG tablet You can take 2 or 3 tablets every 6 hours as needed for pain. You can alternate this with plain Tylenol or the oxycodone. You can purchase this over-the-counter at any drugstore. 03/16/17  Yes Sherrie George, PA-C  oxyCODONE (OXY IR/ROXICODONE) 5 MG immediate release tablet Take 1-2 tablets (5-10 mg total) by mouth every  4 (four) hours as needed for moderate pain. Patient not taking: Reported on 03/27/2017 03/16/17   Sherrie George, PA-C  SUMAtriptan (IMITREX) 25 MG tablet Take 1 tablet (25 mg total) by mouth every 2 (two) hours as needed for migraine. May repeat in 2 hours if headache persists or recurs. 03/27/17   Vivianne Master, PA-C  traMADol (ULTRAM) 50 MG tablet Take 1 tablet (50 mg total)  by mouth every 8 (eight) hours as needed. 03/27/17   Vivianne Master, PA-C    Family History  Problem Relation Age of Onset  . Heart disease Mother   . Diabetes Father   . Cancer Paternal Aunt    Social-unmarried, unemployed; +child, nonsmoker  Objective:   Vitals:   03/27/17 1112  BP: 107/75  Pulse: 76  Temp: 98.4 F (36.9 C)  SpO2: 98%  Weight: 135 lb 12.8 oz (61.6 kg)  Height: 5\' 3"  (1.6 m)    Exam General appearance : Awake, alert, not in any distress. Speech Clear. Not toxic looking HEENT: Atraumatic and Normocephalic, pupils equally reactive to light and accomodation Neck: supple, no JVD. No cervical lymphadenopathy.   Abdomen: Bowel sounds present,some diffuse tenderness; well healing umbilical wound; regular temperature Extremities: B/L Lower Ext shows no edema, both legs are warm to touch Neurology: Awake alert, and oriented X 3, CN II-XII intact, Non focal Skin:No Rash Wounds:no drainage, no pus   Assessment & Plan  1. Tip appendicitis s/p lap chole 03/15/17  -cont prn pain meds  -normal/regular diet  -slowly inc activity  -keep appt 9/13 with gen surg 2. Migraines  -Imitresx   4 week follow up  The patient was given clear instructions to go to ER or return to medical center if symptoms don't improve, worsen or new problems develop. The patient verbalized understanding. The patient was told to call to get lab results if they haven't heard anything in the next week.   This note has been created with Education officer, environmental. Any transcriptional errors are unintentional.    Scot Jun, PA-C [facility] and [facility] [place], [place] [phone]   03/27/2017, 11:48 AM

## 2017-03-29 ENCOUNTER — Ambulatory Visit (INDEPENDENT_AMBULATORY_CARE_PROVIDER_SITE_OTHER): Payer: Self-pay | Admitting: Obstetrics and Gynecology

## 2017-03-29 ENCOUNTER — Encounter: Payer: Self-pay | Admitting: Obstetrics and Gynecology

## 2017-03-29 VITALS — BP 112/75 | HR 79 | Ht 61.0 in | Wt 135.5 lb

## 2017-03-29 DIAGNOSIS — Z113 Encounter for screening for infections with a predominantly sexual mode of transmission: Secondary | ICD-10-CM

## 2017-03-29 DIAGNOSIS — Z1151 Encounter for screening for human papillomavirus (HPV): Secondary | ICD-10-CM

## 2017-03-29 DIAGNOSIS — Z124 Encounter for screening for malignant neoplasm of cervix: Secondary | ICD-10-CM

## 2017-03-29 DIAGNOSIS — R1909 Other intra-abdominal and pelvic swelling, mass and lump: Secondary | ICD-10-CM

## 2017-03-29 NOTE — Progress Notes (Addendum)
Obstetrics and Gynecology New Patient Evaluation  Appointment Date: 03/30/2017  OBGYN Clinic: [facility]  Primary Care Provider: Patient, No Pcp Per  Referring Provider: [facility]  Chief Complaint:  Chief Complaint  Patient presents with  . Mass on cervix    History of Present Illness: Caitlin Bailey is a 36 y.o. Hispanic [phone] (Patient's last menstrual period was 03/01/2017.), seen for the above chief complaint. Her past medical history is significant for h/o fibroids and ovarian cysts and cystectomy  Patient went to ED on 8/29 for  Abdominal pain and had CT scan that showed appendicitis. Also 4-5cm multi cystic "cervical mass" noted. Pt taken back for OR and had an 8/29 laparoscopic appy and partial right oophorectomy. Operative findings states that there was a ruptured hemorrhagic right ovarian cyst and a mildly injected serosal appendix. There's no comment on any blood in the pelvis but I do see some in the anterior cul de sac on the pictures in the Op note; it looks like he evacuated the clot of the already ruptured cyst/ovary and stapled across it.   Surg path showed some inflammation of the appendix and right hemorrhagic cyst.  Patient states she just has periumbilical discomfort but no breast s/s, fevers, chills, chest pain, SOB, nausea, vomiting, abdominal pain, dysuria, hematuria, vaginal itching, constipation, blood in BMs  Review of Systems:  as noted in the History of Present Illness.  Past Medical History:  Past Medical History:  Diagnosis Date  . Acute appendicitis 03/15/2017  . Depression    took meds after mother's passing  . Family history of adverse reaction to anesthesia    hyperthermia; pt denies knowledge of this on 03/15/2017  . Migraine    "too offen when I get too stressed" (03/15/2017)  . Uterine fibroids affecting pregnancy     Past Surgical History:  Past Surgical History:  Procedure  Laterality Date  . APPENDECTOMY  03/15/2017  . CYST EXCISION Left 2000s   "groin/inner thigh"  . LAPAROSCOPIC APPENDECTOMY N/A 03/15/2017   Procedure: APPENDECTOMY LAPAROSCOPIC;  Surgeon: Judeth Horn, MD;  Location: [facility];  Service: General;  Laterality: N/A;  . OVARIAN CYST REMOVAL  2009   ex-lap    Past Obstetrical History:  OB History  Gravida Para Term Preterm AB Living  2 2 2  0 0 2  SAB TAB Ectopic Multiple Live Births  0 0 0 0 2    # Outcome Date GA Lbr Len/2nd Weight Sex Delivery Anes PTL Lv  2 Term 02/20/15 [redacted]w[redacted]d 08:32 / 00:15 8 lb 4.5 oz (3.755 kg) F Vag-Spont EPI  LIV  1 Term 07/05/12 [redacted]w[redacted]d 27:08 / 02:15 9 lb 7 oz (4.28 kg) M Vag-Spont EPI  LIV      Past Gynecological History: As per HPI. Periods: qmonth, regular, 3-5d, no particularly heavy or painful History of Pap Smear(s): Yes.   Last pap 2014, which was negative She is currently using condoms for contraception.    Social History:  Social History   Social History  . Marital status: Single    Spouse name: N/A  . Number of children: N/A  . Years of education: N/A   Occupational History  . Not on file.   Social History Main Topics  . Smoking status: Never Smoker  . Smokeless tobacco: Never Used  . Alcohol use No  . Drug use: No  . Sexual activity: Yes    Birth control/ protection: Condom   Other Topics Concern  .  Not on file   Social History Narrative  . No narrative on file    Family History:  Family History  Problem Relation Age of Onset  . Heart disease Mother   . Diabetes Father   . Cancer Paternal Aunt    She denies any female cancers, bleeding or blood clotting disorders except she states, in [place], an 83 y/o niece had cysts removed from her breasts and sounds like will need additional treatment   Medications [name] had no medications administered during this visit. Current Outpatient Prescriptions  Medication Sig Dispense Refill  . acetaminophen (TYLENOL) 325 MG tablet Take  2 tablets (650 mg total) by mouth every 6 (six) hours as needed for mild pain or fever.    Marland Kitchen ibuprofen (ADVIL,MOTRIN) 200 MG tablet You can take 2 or 3 tablets every 6 hours as needed for pain. You can alternate this with plain Tylenol or the oxycodone. You can purchase this over-the-counter at any drugstore.    . SUMAtriptan (IMITREX) 25 MG tablet Take 1 tablet (25 mg total) by mouth every 2 (two) hours as needed for migraine. May repeat in 2 hours if headache persists or recurs. 10 tablet 0  . traMADol (ULTRAM) 50 MG tablet Take 1 tablet (50 mg total) by mouth every 8 (eight) hours as needed. 30 tablet 0  . oxyCODONE (OXY IR/ROXICODONE) 5 MG immediate release tablet Take 1-2 tablets (5-10 mg total) by mouth every 4 (four) hours as needed for moderate pain. (Patient not taking: Reported on 03/27/2017) 15 tablet 0   No current facility-administered medications for this visit.     Allergies Patient has no known allergies.   Physical Exam:  BP 112/75   Pulse 79   Ht 5\' 1"  (1.549 m)   Wt 135 lb 8 oz (61.5 kg)   LMP 03/01/2017   BMI 25.60 kg/m  Body mass index is 25.6 kg/m. General appearance: Well nourished, well developed female in no acute distress.  Neck:  Supple, normal appearance, and no thyromegaly  Cardiovascular: normal s1 and s2.  No murmurs, rubs or gallops. Respiratory:  Clear to auscultation bilateral. Normal respiratory effort Abdomen: positive bowel sounds and no masses, hernias; diffusely non tender to palpation, non distended. Well healed l/s port sites Breasts: breasts appear normal, no suspicious masses, no skin or nipple changes or axillary nodes, and normal palpation. Neuro/Psych:  Normal mood and affect.  Skin:  Warm and dry.  Lymphatic:  No inguinal lymphadenopathy.   Pelvic exam: is not limited by body habitus EGBUS: within normal limits, Vagina: within normal limits and with no blood or discharge in the vault, Cervix: normal appearing cervix without tenderness,  discharge or lesions. Uterus:  nonenlarged and non tender and Adnexa:  normal adnexa and no mass, fullness, tenderness Rectovaginal: deferred  Laboratory: as above  Radiology:  CLINICAL DATA:  RIGHT lower quadrant pain for 4 days. History of uterine fibroids and ovarian cystectomy.  EXAM: CT ABDOMEN AND PELVIS WITH CONTRAST  TECHNIQUE: Multidetector CT imaging of the abdomen and pelvis was performed using the standard protocol following bolus administration of intravenous contrast.  CONTRAST:  135mL ISOVUE-300 IOPAMIDOL (ISOVUE-300) INJECTION 61%  COMPARISON:  None.  FINDINGS: LOWER CHEST: Dependent atelectasis. Included heart size is normal. No pericardial effusion.  HEPATOBILIARY: Liver and gallbladder are normal.  PANCREAS: Normal.  SPLEEN: Normal.  ADRENALS/URINARY TRACT: Kidneys are orthotopic, demonstrating symmetric enhancement. No nephrolithiasis, hydronephrosis or solid renal masses. The unopacified ureters are normal in course and caliber. Urinary bladder  is partially distended and unremarkable. Normal adrenal glands.  STOMACH/BOWEL: The stomach, small and large bowel are normal in course and caliber without inflammatory changes. Appendix is normal in size, however appears hyperemic with focal inflammation at the appendiceal tip (coronal 29/69). No appendicolith.  VASCULAR/LYMPHATIC: Aortoiliac vessels are normal in course and caliber. No lymphadenopathy by CT size criteria.  REPRODUCTIVE: Multi-cystic cervical mass measuring 5.3 x 4 cm extending into the upper vaginal vault. 15 mm fluid distended endometrium.  OTHER: No intraperitoneal free fluid or free air.  MUSCULOSKELETAL: Nonacute.  IMPRESSION: 1. Acute tip appendicitis. 2. Multi cystic 5.3 x 4 cm cervical mass. Differential diagnosis includes tunnel cluster Nabothian cysts or, neoplasm. Recommend gynecologic consultation and, ultrasound on a nonemergent basis. Acute findings  discussed with and reconfirmed by PA ALEXANDRA LAW on 03/15/2017 at 2:50 am.   Electronically Signed   By: Elon Alas M.D.   On: 03/15/2017 02:50  Assessment: pt stable  Plan:  1. Abdominal mass of other site I d/w her that on her imaging she had no FF in the pelvis and there was the cyst but during the surgery, nothing except a ruptured RO hemorrhagic cyst was noted. I told her that given this, there is a good chance that there was a hemorrhagic cyst when she presented and it was causing her pain and it could have ruptured b/w being in the ER and going to surgery and that's why nothing was noted during the surgery. I will get a TVUS and consider another CT if the u/s is negative. Will get an u/s in about two weeks so it can be as her period is ending to decrease risks of incidental cysts. Pap updated today.   Orders Placed This Encounter  Procedures  . US Transvaginal Non-OB  . US PELVIS (TRANSABDOMINAL ONLY)   Best # is : [phone]  In person interpreter used  RTC PRN  Durene Romans MD Attending Center for [facility])

## 2017-03-30 ENCOUNTER — Encounter: Payer: Self-pay | Admitting: Obstetrics and Gynecology

## 2017-04-03 ENCOUNTER — Ambulatory Visit: Payer: Self-pay | Admitting: Obstetrics & Gynecology

## 2017-04-03 LAB — CYTOLOGY - PAP
Chlamydia: NEGATIVE
Diagnosis: NEGATIVE
HPV: NOT DETECTED
NEISSERIA GONORRHEA: NEGATIVE
TRICH (WINDOWPATH): NEGATIVE

## 2017-04-07 ENCOUNTER — Ambulatory Visit (HOSPITAL_COMMUNITY)
Admission: RE | Admit: 2017-04-07 | Discharge: 2017-04-07 | Disposition: A | Discharge status: Home / Self Care | Payer: Self-pay | Source: Ambulatory Visit | Attending: Obstetrics and Gynecology | Admitting: Obstetrics and Gynecology

## 2017-04-07 DIAGNOSIS — R1909 Other intra-abdominal and pelvic swelling, mass and lump: Secondary | ICD-10-CM | POA: Insufficient documentation

## 2017-04-11 ENCOUNTER — Telehealth: Payer: Self-pay | Admitting: *Deleted

## 2017-04-11 ENCOUNTER — Telehealth: Payer: Self-pay | Admitting: Obstetrics and Gynecology

## 2017-04-11 NOTE — Telephone Encounter (Signed)
Called patient to inform her of results per Dr. Ilda Basset. I was unable to reach patient. Will try again.

## 2017-04-11 NOTE — Telephone Encounter (Signed)
GYN Telephone Note  Patient called at [phone] with phone interpreter x 2 but had connection issues with the number. In basket message sent and I'll try again next week.   Durene Romans MD Attending [facility] (Faculty Practice) 04/11/2017 Time: [phone]

## 2017-04-11 NOTE — Telephone Encounter (Signed)
-----   Message from Aletha Halim, MD sent at 04/11/2017  9:08 AM EDT ----- I tried to get in contact with her with the phone interpreter but without success. Can you try and call her and let her know that her ultrasound findings were consistent with what they saw on the CT scan (nothing concerning) and I'll call her next week to go over it with her? thanks

## 2017-04-17 NOTE — Telephone Encounter (Signed)
Using Spanish interpreter, I reviewed with the patient her u/s results. I also told her that Dr Ilda Basset will call to explain in more detail and how to follow up. Patient voiced understanding.

## 2017-04-18 ENCOUNTER — Telehealth: Payer: Self-pay | Admitting: Obstetrics and Gynecology

## 2017-04-18 NOTE — Telephone Encounter (Signed)
GYN Telephone Note Patient called at [phone] via phone interpreter. Voicemail came on. Interpreter was asked to leave VM telling patient to call the clinic.   If the patient calls the clinic, please schedule her for follow up visit with me to go over her ultrasound results for sometime in the next 2-3wks.   Durene Romans MD Attending [facility] (Faculty Practice) 04/18/2017 Time: 1134am

## 2017-04-28 ENCOUNTER — Encounter: Payer: Self-pay | Admitting: Family Medicine

## 2017-04-28 ENCOUNTER — Ambulatory Visit: Payer: Self-pay | Attending: Family Medicine | Admitting: Family Medicine

## 2017-04-28 VITALS — BP 107/70 | HR 77 | Temp 98.4°F | Resp 18 | Ht 62.0 in | Wt 137.2 lb

## 2017-04-28 DIAGNOSIS — Z9049 Acquired absence of other specified parts of digestive tract: Secondary | ICD-10-CM

## 2017-04-28 DIAGNOSIS — H9313 Tinnitus, bilateral: Secondary | ICD-10-CM

## 2017-04-28 DIAGNOSIS — Z23 Encounter for immunization: Secondary | ICD-10-CM

## 2017-04-28 DIAGNOSIS — R1030 Lower abdominal pain, unspecified: Secondary | ICD-10-CM

## 2017-04-28 DIAGNOSIS — G43009 Migraine without aura, not intractable, without status migrainosus: Secondary | ICD-10-CM

## 2017-04-28 DIAGNOSIS — R42 Dizziness and giddiness: Secondary | ICD-10-CM

## 2017-04-28 LAB — POCT URINE PREGNANCY: Preg Test, Ur: NEGATIVE

## 2017-04-28 MED ORDER — MECLIZINE HCL 25 MG PO TABS: 25.0000 mg | ORAL_TABLET | Freq: Three times a day (TID) | ORAL | 0 refills | Status: DC | PRN

## 2017-04-28 MED ORDER — TOPIRAMATE 25 MG PO CPSP: 25.0000 mg | ORAL_CAPSULE | Freq: Every day | ORAL | 2 refills | Status: DC

## 2017-04-28 NOTE — Patient Instructions (Signed)
Cefalea migraosa (Migraine Headache) Una cefalea migraosa es un dolor muy intenso y punzante en uno o ambos lados de la cabeza. Hable con su mdico [employer] factores que pueden causar Medical illustrator) las Psychologist, occupational. Tyrone solo los [employer] segn le haya indicado el mdico.  Cuando tenga la migraa, acustese en un cuarto oscuro y tranquilo  Lleve un registro diario para averiguar si hay ciertas cosas que le provocan la cefalea migraosa. Por ejemplo, escriba: ? Lo que usted come y bebe. ? Cunto tiempo duerme. ? Algn cambio en su dieta o en los medicamentos.  Beba menos alcohol.  Si fuma, deje de hacerlo.  Duerma lo suficiente.  Disminuya todo tipo de estrs de la vida diaria.  [place] luces tenues si le [employer] luces brillantes o hacen que la [place].  SOLICITE AYUDA DE INMEDIATO SI:  La migraa empeora.  Tiene fiebre.  Presenta rigidez en el cuello.  Tiene dificultad para ver.  Sus msculos estn dbiles, o pierde el control muscular.  Pierde el equilibrio o tiene problemas para Writer.  Siente que se desvanece (debilidad) o se desmaya.  Tiene malos sntomas que son diferentes a los primeros sntomas.  ASEGRESE DE QUE:  Comprende estas instrucciones.  Controlar su afeccin.  Recibir ayuda de inmediato si no mejora o si empeora.  Esta informacin no tiene Marine scientist el consejo del mdico. Asegrese de hacerle al mdico cualquier pregunta que tenga. Document Released: 09/30/2008 Document Revised: 07/09/2013 Document Reviewed: 12/21/2015 Elsevier Interactive Patient Education  2017 [employer].

## 2017-04-28 NOTE — Progress Notes (Signed)
Patient is here for f/up  

## 2017-04-28 NOTE — Progress Notes (Signed)
Subjective:  Patient ID: Caitlin Bailey, female    DOB: July 12, 1981  Age: 36 y.o. MRN: [id number]  CC: Follow-up   HPI [name] presents for follow up. Interpreter services used [phone] # Sherilyn Cooter . History of appendectomy. She reports following up with general surgeon 4 week post op. She episodes brief intermittent periods of abdominal pain. 5//10 most day. Symptoms waxing and waning. Denies any N/V or bloody stools. She does report some episode of diarrhea last week but not chronic. She c/o vertigo, constant. Denies any palpitations, syncope or near syncope. SHe does report bilateral tinnitus. She complaints of migraine headaches. Onset 10 yrs ago. Symptoms constant. Aggravating factors hot climates. No difficulty keeping blacne or weakness of extremities    Outpatient Medications Prior to Visit  Medication Sig Dispense Refill  . acetaminophen (TYLENOL) 325 MG tablet Take 2 tablets (650 mg total) by mouth every 6 (six) hours as needed for mild pain or fever.    Marland Kitchen ibuprofen (ADVIL,MOTRIN) 200 MG tablet You can take 2 or 3 tablets every 6 hours as needed for pain. You can alternate this with plain Tylenol or the oxycodone. You can purchase this over-the-counter at any drugstore.    Marland Kitchen oxyCODONE (OXY IR/ROXICODONE) 5 MG immediate release tablet Take 1-2 tablets (5-10 mg total) by mouth every 4 (four) hours as needed for moderate pain. (Patient not taking: Reported on 03/27/2017) 15 tablet 0  . SUMAtriptan (IMITREX) 25 MG tablet Take 1 tablet (25 mg total) by mouth every 2 (two) hours as needed for migraine. May repeat in 2 hours if headache persists or recurs. 10 tablet 0  . traMADol (ULTRAM) 50 MG tablet Take 1 tablet (50 mg total) by mouth every 8 (eight) hours as needed. 30 tablet 0   No facility-administered medications prior to visit.     ROS Review of Systems  HENT: Positive for tinnitus.   Respiratory: Negative.   Cardiovascular: Negative.   Gastrointestinal: Positive for abdominal  pain.  Neurological: Positive for dizziness and headaches (history of migraines).    Objective:  BP 107/70 (BP Location: Left Arm, Patient Position: Sitting, Cuff Size: Normal)   Pulse 77   Temp 98.4 F (36.9 C) (Oral)   Resp 18   Ht 5\' 2"  (1.575 m)   Wt 137 lb 3.2 oz (62.2 kg)   SpO2 98%   BMI 25.09 kg/m   BP/Weight 04/28/2017 03/29/2017 03/27/2017  Systolic BP 107 112 107  Diastolic BP 70 75 75  Wt. (Lbs) 137.2 135.5 135.8  BMI 25.09 25.6 24.06     Physical Exam  Constitutional: She is oriented to person, place, and time. She appears well-developed and well-nourished.  HENT:  Head: Normocephalic and atraumatic.  Right Ear: External ear normal.  Left Ear: External ear normal.  Nose: Nose normal.  Mouth/Throat: Oropharynx is clear and moist.  Eyes: Pupils are equal, round, and reactive to light. Conjunctivae and EOM are normal.  Neck: Normal range of motion. Neck supple.  Cardiovascular: Normal rate, regular rhythm, normal heart sounds and intact distal pulses.   Pulmonary/Chest: Effort normal and breath sounds normal.  Abdominal: Soft. Bowel sounds are normal. There is no tenderness.  Musculoskeletal: Normal range of motion.  Neurological: She is alert and oriented to person, place, and time. She has normal reflexes. No cranial nerve deficit. She displays a negative Romberg sign. Coordination and gait normal.  Skin: Skin is warm and dry.  Psychiatric: She has a normal mood and affect.  Nursing note  and vitals reviewed.    Assessment & Plan:   1. Migraine without aura and without status migrainosus, not intractable No red flags. Added topiramate for migraine. Consider neurology referral if symptoms don't improve. - topiramate (TOPAMAX) 25 MG capsule; Take 1 capsule (25 mg total) by mouth daily.  Dispense: 30 capsule; Refill: 2  2. Tinnitus, bilateral  - Ambulatory referral to Audiology  3. Vertigo  - Ambulatory referral to Audiology - meclizine (ANTIVERT) 25  MG tablet; Take 1 tablet (25 mg total) by mouth 3 (three) times daily as needed.  Dispense: 30 tablet; Refill: 0  4. History of appendectomy  - DG Abd 2 Views; Future  5. Lower abdominal pain  - DG Abd 2 Views; Future - POCT urine pregnancy - Basic Metabolic Panel - CBC  6. Needs flu shot  - Flu Vaccine QUAD 36+ mos IM   Meds ordered this encounter  Medications  . meclizine (ANTIVERT) 25 MG tablet    Sig: Take 1 tablet (25 mg total) by mouth 3 (three) times daily as needed.    Dispense:  30 tablet    Refill:  0    Order Specific Question:   Supervising Provider    Answer:   Quentin Angst [id number]  . topiramate (TOPAMAX) 25 MG capsule    Sig: Take 1 capsule (25 mg total) by mouth daily.    Dispense:  30 capsule    Refill:  2    Order Specific Question:   Supervising Provider    Answer:   Quentin Angst [id number]    Follow-up: Return in about 8 weeks (around 06/23/2017), or if symptoms worsen or fail to improve, for Migraine.   Lizbeth Bark FNP

## 2017-04-30 LAB — CBC
HEMATOCRIT: 41.4 % (ref 34.0–46.6)
HEMOGLOBIN: 13.6 g/dL (ref 11.1–15.9)
MCH: 30.5 pg (ref 26.6–33.0)
MCHC: 32.9 g/dL (ref 31.5–35.7)
MCV: 93 fL (ref 79–97)
Platelets: 287 10*3/uL (ref 150–379)
RBC: 4.46 x10E6/uL (ref 3.77–5.28)
RDW: 14.1 % (ref 12.3–15.4)
WBC: 5.8 10*3/uL (ref 3.4–10.8)

## 2017-04-30 LAB — BASIC METABOLIC PANEL
BUN/Creatinine Ratio: 28 — ABNORMAL HIGH (ref 9–23)
BUN: 15 mg/dL (ref 6–20)
CALCIUM: 9.3 mg/dL (ref 8.7–10.2)
CHLORIDE: 103 mmol/L (ref 96–106)
CO2: 21 mmol/L (ref 20–29)
Creatinine, Ser: 0.53 mg/dL — ABNORMAL LOW (ref 0.57–1.00)
GFR calc Af Amer: 143 mL/min/{1.73_m2} (ref >59)
GFR calc non Af Amer: 124 mL/min/{1.73_m2} (ref >59)
Glucose: 79 mg/dL (ref 65–99)
POTASSIUM: 4.1 mmol/L (ref 3.5–5.2)
Sodium: 140 mmol/L (ref 134–144)

## 2017-05-19 ENCOUNTER — Telehealth: Payer: Self-pay

## 2017-05-19 NOTE — Telephone Encounter (Signed)
CMA call regarding lab results   Patient did not answer ut left a detailed message & to call back if have any questions

## 2017-05-19 NOTE — Telephone Encounter (Signed)
-----   Message from Alfonse Spruce, [place] sent at 05/16/2017  7:37 PM EDT ----- Labs that evaluated your blood cells, fluid and electrolyte balance are normal. No signs of anemia, acute infection, or inflammation present. Increase water intake.

## 2017-05-24 ENCOUNTER — Encounter: Payer: Self-pay | Admitting: Obstetrics and Gynecology

## 2017-05-24 ENCOUNTER — Ambulatory Visit (INDEPENDENT_AMBULATORY_CARE_PROVIDER_SITE_OTHER): Payer: Self-pay | Admitting: Obstetrics and Gynecology

## 2017-05-24 ENCOUNTER — Telehealth: Payer: Self-pay

## 2017-05-24 VITALS — BP 109/65 | HR 75 | Wt 136.4 lb

## 2017-05-24 DIAGNOSIS — Z603 Acculturation difficulty: Secondary | ICD-10-CM | POA: Insufficient documentation

## 2017-05-24 DIAGNOSIS — Z789 Other specified health status: Secondary | ICD-10-CM

## 2017-05-24 DIAGNOSIS — N888 Other specified noninflammatory disorders of cervix uteri: Secondary | ICD-10-CM

## 2017-05-24 NOTE — Progress Notes (Signed)
Obstetrics and Gynecology Return Patient Evaluation  Appointment Date: 05/24/2017  OBGYN Clinic: [facility]  Primary Care Provider: Alfonse Spruce  Referring Provider: [facility]  Chief Complaint: u/s results discussion  History of Present Illness: Caitlin Bailey is a 36 y.o. Hispanic [phone] (Patient's last menstrual period was 05/15/2017 (approximate).), seen for the above chief complaint. Her past medical history is significant for h/o fibroids, h/o ovarian cysts and cystectomy   Patient seen on 03/29/2017 for NPE as referral from [facility] for CT findings on her cervix. Pap smear done (negative cyto and hpv) and u/s ordered. U/s was normal but did find multiple, avascular cysts, c/w what was seen on CT scan; pt is here to go over findings given issues with contacting patient on the phone and language barrier.   She still has some lower abdominal discomfort which she states comes and goes and pre-dates her surgery by several months.   Review of Systems:  as noted in the History of Present Illness.  Past Medical History:  Past Medical History:  Diagnosis Date  . Acute appendicitis 03/15/2017  . Depression    took meds after mother's passing  . Family history of adverse reaction to anesthesia    hyperthermia; pt denies knowledge of this on 03/15/2017  . Migraine    "too offen when I get too stressed" (03/15/2017)  . Uterine fibroids affecting pregnancy     Past Surgical History:  Past Surgical History:  Procedure Laterality Date  . APPENDECTOMY  03/15/2017  . CYST EXCISION Left 2000s   "groin/inner thigh"  . OVARIAN CYST REMOVAL  2009   ex-lap    Past Obstetrical History:  OB History  Gravida Para Term Preterm AB Living  2 2 2  0 0 2  SAB TAB Ectopic Multiple Live Births  0 0 0 0 2    # Outcome Date GA Lbr Len/2nd Weight Sex Delivery Anes PTL Lv  2 Term 02/20/15 [redacted]w[redacted]d 08:32 / 00:15 8 lb 4.5 oz (3.755 kg) F Vag-Spont EPI  LIV   1 Term 07/05/12 [redacted]w[redacted]d 27:08 / 02:15 9 lb 7 oz (4.28 kg) M Vag-Spont EPI  LIV      Past Gynecological History: As per HPI. Periods: qmonth, regular, 3-5d, no particularly heavy or painful History of Pap Smear(s): Yes.   Last pap 2018, which was negative She is currently using condoms for contraception.    Social History:  Social History   Socioeconomic History  . Marital status: Single    Spouse name: Not on file  . Number of children: Not on file  . Years of education: Not on file  . Highest education level: Not on file  Social Needs  . Financial resource strain: Not on file  . Food insecurity - worry: Not on file  . Food insecurity - inability: Not on file  . Transportation needs - medical: Not on file  . Transportation needs - non-medical: Not on file  Occupational History  . Not on file  Tobacco Use  . Smoking status: Never Smoker  . Smokeless tobacco: Never Used  Substance and Sexual Activity  . Alcohol use: No  . Drug use: No  . Sexual activity: Yes    Birth control/protection: Condom  Other Topics Concern  . Not on file  Social History Narrative  . Not on file    Family History:  Family History  Problem Relation Age of Onset  . Heart disease Mother   . Diabetes Father   .  Cancer Paternal Aunt    She denies any female cancers, bleeding or blood clotting disorders except she states, in [place], an 74 y/o niece had cysts removed from her breasts and sounds like will need additional treatment   Medications [name] had no medications administered during this visit. Current Outpatient Medications  Medication Sig Dispense Refill  . acetaminophen (TYLENOL) 325 MG tablet Take 2 tablets (650 mg total) by mouth every 6 (six) hours as needed for mild pain or fever.    Marland Kitchen ibuprofen (ADVIL,MOTRIN) 200 MG tablet You can take 2 or 3 tablets every 6 hours as needed for pain. You can alternate this with plain Tylenol or the oxycodone. You can purchase this  over-the-counter at any drugstore.    . SUMAtriptan (IMITREX) 25 MG tablet Take 1 tablet (25 mg total) by mouth every 2 (two) hours as needed for migraine. May repeat in 2 hours if headache persists or recurs. 10 tablet 0   No current facility-administered medications for this visit.     Allergies Patient has no known allergies.   Physical Exam:  BP 109/65   Pulse 75   Wt 136 lb 6.4 oz (61.9 kg)   LMP 05/15/2017 (Approximate)   BMI 24.95 kg/m  Body mass index is 24.95 kg/m. General appearance: Well nourished, well developed female in no acute distress.   Laboratory: none  Radiology:  CLINICAL DATA:  Cervical mass seen on CT  EXAM: TRANSABDOMINAL AND TRANSVAGINAL ULTRASOUND OF PELVIS  TECHNIQUE: Both transabdominal and transvaginal ultrasound examinations of the pelvis were performed. Transabdominal technique was performed for global imaging of the pelvis including uterus, ovaries, adnexal regions, and pelvic cul-de-sac. It was necessary to proceed with endovaginal exam following the transabdominal exam to visualize the uterus, endometrium, ovaries and adnexa .  COMPARISON:  CT 03/15/2017  FINDINGS: Uterus  Measurements: 10.0 x 4.3 x 5.7 cm. Numerous small cystic spaces noted within the prominent cervix corresponding to the mass seen on CT.  Endometrium  Thickness: 11 mm in thickness.  No focal abnormality visualized.  Right ovary  Measurements: 2.1 x 1.3 x 2.1 cm. Normal appearance/no adnexal mass.  Left ovary  Measurements: 2.4 x 2.2 x 2.5 cm. Normal appearance/no adnexal mass.  Other findings  No abnormal free fluid.  IMPRESSION: Numerous dilated cystic spaces noted within a prominent cervix as seen on prior CT. This does not have the typical appearance of Nabothian cysts. I would recommend further evaluation with MRI with and without contrast.   Electronically Signed   By: Rolm Baptise M.D.   On: 04/07/2017 12:09  Assessment:  pt stable  Plan:  1. Cervical cyst D/w her that I feel that the findings are likely just benign cysts but would like Gyn Onc's input before ordering any additional imaging and recommendations on how to proceed.  - Ambulatory referral to Gynecologic Oncology  2. Language barrier Interpreter used  Orders Placed This Encounter  Procedures  . Ambulatory referral to Gynecologic Oncology    RTC PRN  Durene Romans MD Attending [facility])

## 2017-05-24 NOTE — Progress Notes (Signed)
[employer] interpreter Dan Europe [phone]

## 2017-05-24 NOTE — Telephone Encounter (Signed)
Called patient with [facility] interpreter Glenard Haring [phone] to inform her of her [place] appointment scheduled for November 28th at 9:45am. Got vm. Left message stating date and time of appointment.

## 2017-06-14 ENCOUNTER — Ambulatory Visit: Payer: Self-pay | Attending: Gynecologic Oncology | Admitting: Gynecologic Oncology

## 2017-06-14 ENCOUNTER — Encounter: Payer: Self-pay | Admitting: Gynecologic Oncology

## 2017-06-14 ENCOUNTER — Other Ambulatory Visit: Payer: Self-pay | Admitting: Gynecologic Oncology

## 2017-06-14 VITALS — BP 112/76 | HR 78 | Temp 98.6°F | Resp 18 | Ht 61.0 in | Wt 138.7 lb

## 2017-06-14 DIAGNOSIS — N888 Other specified noninflammatory disorders of cervix uteri: Secondary | ICD-10-CM

## 2017-06-14 DIAGNOSIS — N8189 Other female genital prolapse: Secondary | ICD-10-CM | POA: Insufficient documentation

## 2017-06-14 DIAGNOSIS — Z79899 Other long term (current) drug therapy: Secondary | ICD-10-CM | POA: Insufficient documentation

## 2017-06-14 DIAGNOSIS — R1907 Generalized intra-abdominal and pelvic swelling, mass and lump: Secondary | ICD-10-CM

## 2017-06-14 DIAGNOSIS — Z833 Family history of diabetes mellitus: Secondary | ICD-10-CM | POA: Insufficient documentation

## 2017-06-14 DIAGNOSIS — N889 Noninflammatory disorder of cervix uteri, unspecified: Secondary | ICD-10-CM | POA: Insufficient documentation

## 2017-06-14 DIAGNOSIS — Z8249 Family history of ischemic heart disease and other diseases of the circulatory system: Secondary | ICD-10-CM | POA: Insufficient documentation

## 2017-06-14 NOTE — Patient Instructions (Signed)
Yo te llamo con los resultados de tu MRI.

## 2017-06-14 NOTE — Progress Notes (Signed)
Consult Note: Gyn-Onc  Caitlin Bailey 36 y.o. female  CC:  Chief Complaint  Patient presents with  . Cervical mass    HPI: Patient is seen today in consultation at the request of Dr. Ilda Basset.   Patient is a very pleasant 36 year old gravida 2 para 2 who underwent an appendectomy and a right ovarian cystectomy 03/15/2017. Patient had presented to the ED on 8/29 for abdominal pain and had CT scan that showed appendicitis. Also 4-5cm multi cystic "cervical mass" noted. Pt taken back for OR and had an 8/29 laparoscopic appy and partial right oophorectomy.  Pathology was consistent with an inflamed appendix. The ovarian cyst had a hemorrhagic cyst with no evidence of malignancy. Should a Pap smear 03/29/2017. Her Pap smear was normal with negative HR-HPV. She had been referred by [facility] to Dr. Ilda Basset secondary to CT findings on her cervix.  CT 8/18: REPRODUCTIVE: Multi-cystic cervical mass measuring 5.3 x 4 cm  extending into the upper vaginal vault. 15 mm fluid distended  endometrium.  OTHER: No intraperitoneal free fluid or free air.  IMPRESSION:  1. Acute tip appendicitis.  2. Multi cystic 5.3 x 4 cm cervical mass. Differential diagnosis includes tunnel cluster Nabothian cysts or, neoplasm.  U/S 9/18: Uterus Measurements: 10.0 x 4.3 x 5.7 cm. Numerous small cystic spaces noted within the prominent cervix corresponding to the mass seen on CT.  Endometrium:Thickness: 11 mm in thickness.  No focal abnormality visualized.  Right ovary :Measurements: 2.1 x 1.3 x 2.1 cm. Normal appearance/no adnexal mass.  Left ovary: Measurements: 2.4 x 2.2 x 2.5 cm. Normal appearance/no adnexal mass. IMPRESSION: Numerous dilated cystic spaces noted within a prominent cervix as seen on prior CT. This does not have the typical appearance of Nabothian cysts. I would recommend further evaluation with MRI with and without contrast.  The patient had several obstetrical  ultrasound which we were able to get reports of from 2013. At that time there was no comment about any abnormal cystic area on her cervix.   She states that for about the last 2 months she has had some discomfort with intercourse as well as some feeling of pressure when she walks at work. It is mild. There is no post total bleeding. Her last cycle was October 30 and irregular. Her husband uses condoms. She denies any change in her bowel or bladder habits or any nausea vomiting. She denies any fevers or chills. She is a 36-year-old son and a 31-year-old daughter. Her son was over 10 pounds and she had significant obstructive tearing. She had 2 spontaneous vaginal deliveries. The only cancer in her family is a maternal great aunt who had breast cancer but she was quite a bit older. She does have a niece who had a lump removed in her breast. This was done in [place] and she does not know if it was benign or malignant.  Current Meds:  Outpatient Encounter Medications as of 06/14/2017  Medication Sig  . acetaminophen (TYLENOL) 325 MG tablet Take 2 tablets (650 mg total) by mouth every 6 (six) hours as needed for mild pain or fever.  Marland Kitchen ibuprofen (ADVIL,MOTRIN) 200 MG tablet You can take 2 or 3 tablets every 6 hours as needed for pain. You can alternate this with plain Tylenol or the oxycodone. You can purchase this over-the-counter at any drugstore.  . meclizine (ANTIVERT) 25 MG tablet Take 1 tablet (25 mg total) by mouth 3 (three) times daily as needed. (Patient not taking: Reported  on 05/24/2017)  . oxyCODONE (OXY IR/ROXICODONE) 5 MG immediate release tablet Take 1-2 tablets (5-10 mg total) by mouth every 4 (four) hours as needed for moderate pain. (Patient not taking: Reported on 03/27/2017)  . SUMAtriptan (IMITREX) 25 MG tablet Take 1 tablet (25 mg total) by mouth every 2 (two) hours as needed for migraine. May repeat in 2 hours if headache persists or recurs.  . topiramate (TOPAMAX) 25 MG capsule Take 1  capsule (25 mg total) by mouth daily. (Patient not taking: Reported on 05/24/2017)  . traMADol (ULTRAM) 50 MG tablet Take 1 tablet (50 mg total) by mouth every 8 (eight) hours as needed. (Patient not taking: Reported on 05/24/2017)   No facility-administered encounter medications on file as of 06/14/2017.     Allergy: No Known Allergies  Social Hx:   Social History   Socioeconomic History  . Marital status: Single    Spouse name: Not on file  . Number of children: Not on file  . Years of education: Not on file  . Highest education level: Not on file  Social Needs  . Financial resource strain: Not on file  . Food insecurity - worry: Not on file  . Food insecurity - inability: Not on file  . Transportation needs - medical: Not on file  . Transportation needs - non-medical: Not on file  Occupational History  . Not on file  Tobacco Use  . Smoking status: Never Smoker  . Smokeless tobacco: Never Used  Substance and Sexual Activity  . Alcohol use: No  . Drug use: No  . Sexual activity: Yes    Birth control/protection: Condom  Other Topics Concern  . Not on file  Social History Narrative  . Not on file    Past Surgical Hx:  Past Surgical History:  Procedure Laterality Date  . APPENDECTOMY  03/15/2017  . CYST EXCISION Left 2000s   "groin/inner thigh"  . LAPAROSCOPIC APPENDECTOMY N/A 03/15/2017   Procedure: APPENDECTOMY LAPAROSCOPIC;  Surgeon: Judeth Horn, MD;  Location: [facility];  Service: General;  Laterality: N/A;  . OVARIAN CYST REMOVAL  2009   ex-lap    Past Medical Hx:  Past Medical History:  Diagnosis Date  . Acute appendicitis 03/15/2017  . Depression    took meds after mother's passing  . Family history of adverse reaction to anesthesia    hyperthermia; pt denies knowledge of this on 03/15/2017  . Migraine    "too offen when I get too stressed" (03/15/2017)  . Uterine fibroids affecting pregnancy     Oncology Hx:   No history exists.    Family Hx:  Family  History  Problem Relation Age of Onset  . Heart disease Mother   . Diabetes Father   . Cancer Paternal Aunt     Vitals:  Blood pressure 112/76, pulse 78, temperature 98.6 F (37 C), temperature source Oral, resp. rate 18, height 5\' 1"  (1.549 m), weight 138 lb 11.2 oz (62.9 kg), last menstrual period 05/15/2017, SpO2 100 %, unknown if currently breastfeeding.  Physical Exam: Well-nourished, well-developed female in no acute distress.  Neck: Supple, no lymphadenopathy, no thyromegaly.  Lungs: Clear to auscultation bilaterally  Cardiac: Regular rate and rhythm  Abdomen: Well-healed surgical incisions. Abdomen is soft, nontender, nondistended. There are no palpable masses or prostatomegaly. There is no evidence of any incisional hernias.  Groins: No lymphadenopathy.  Extremities: No edema.  Pelvic: External genitalia is normal. There does appear to be 2 skin tags bilaterally at the level  of the hymen that measure approximately a centimeter in length. She does have fairly significant pelvic relaxation with lateral wall relaxation. The cervix is normal but somewhat breathless. The anterior lip is in a Coxcomb shape. There are no gross visible lesions. Bimanual examination does reveal a smooth mass posterior to the level of the posterior cervix/lower uterine segment. The uterus is of normal size shape and consistency. There are no adnexal masses.  Assessment/Plan: 36 year old with a cystic area in the posterior cervix does not appear to be particularly worrisome on imaging. She's had both CT scans and ultrasounds. Ultrasound did recommend MRI which I have ordered. We will get an MRI of the pelvis with the results. Her Pap smear cytology was normal. There are no visible lesions to biopsy. Pending the results of the MRI we may or may not proceed with an exam under anesthesia and FNA of the posterior cervix.  She does have fairly significant pelvic relaxation for her age. She did have a baby that  was over 10 pounds. We did discuss Kegel exercises. Ultimately she may wish to have reconstructive surgery however she and her husband are not sure that they have completed their family.  I appreciate the opportunity to partner in the care of this very pleasant patient.  Troy Hartzog A., MD 06/14/2017, 10:08 AM

## 2017-06-21 ENCOUNTER — Ambulatory Visit (HOSPITAL_COMMUNITY)
Admission: RE | Admit: 2017-06-21 | Discharge: 2017-06-21 | Disposition: A | Discharge status: Home / Self Care | Payer: Self-pay | Source: Ambulatory Visit | Attending: Gynecologic Oncology | Admitting: Gynecologic Oncology

## 2017-06-21 DIAGNOSIS — N888 Other specified noninflammatory disorders of cervix uteri: Secondary | ICD-10-CM | POA: Insufficient documentation

## 2017-06-21 MED ORDER — GADOBENATE DIMEGLUMINE 529 MG/ML IV SOLN
15.0000 mL | Freq: Once | INTRAVENOUS | Status: AC | PRN
  Administered 2017-06-21: 12 mL via INTRAVENOUS

## 2017-06-29 ENCOUNTER — Other Ambulatory Visit: Payer: Self-pay | Admitting: Gynecologic Oncology

## 2017-06-29 DIAGNOSIS — N888 Other specified noninflammatory disorders of cervix uteri: Secondary | ICD-10-CM

## 2017-06-29 NOTE — Progress Notes (Signed)
Per Dr. Alycia Rossetti, plan to see if biopsy is possible with IR of the cervical mass.  Spoke with IR representative.  [place] bx to be ordered.

## 2017-07-19 ENCOUNTER — Other Ambulatory Visit: Payer: Self-pay

## 2017-07-19 ENCOUNTER — Encounter (HOSPITAL_BASED_OUTPATIENT_CLINIC_OR_DEPARTMENT_OTHER): Payer: Self-pay | Admitting: *Deleted

## 2017-07-19 ENCOUNTER — Telehealth: Payer: Self-pay | Admitting: Gynecologic Oncology

## 2017-07-19 NOTE — Progress Notes (Signed)
SPOKE Foster [phone].  PT VERBALIZED UNDERSTANDING TO ARRIVE AT 0745 AND NPO AFTER MN.  NEEDS HG AND URINE PREG.  REQUESTED [place] INTERPRETER DOS TO ARRIVE AT 0730.

## 2017-07-19 NOTE — Telephone Encounter (Signed)
Have called and LM for patient on 12/20; 12/31 and 07/19/17 for her to call me back to discuss results and surgical plan. Have left [facility] and [facility] contact information and have not heard back from her. PG

## 2017-07-21 ENCOUNTER — Telehealth: Payer: Self-pay

## 2017-07-21 NOTE — Telephone Encounter (Signed)
Told Caitlin Bailey that she will be put to sleep in the or so Dr. Rogelio Seen can visualize her cervix and obtain a biopsy. She will go home the same day once she recovers. Dr. Alycia Rossetti has been trying to reach her to discuss the procedure.  She will see Dr. Alycia Rossetti prior to the procedure on 08-02-17. Answered pt.'s questions.

## 2017-08-02 ENCOUNTER — Encounter (HOSPITAL_BASED_OUTPATIENT_CLINIC_OR_DEPARTMENT_OTHER): Payer: Self-pay

## 2017-08-02 ENCOUNTER — Ambulatory Visit (HOSPITAL_BASED_OUTPATIENT_CLINIC_OR_DEPARTMENT_OTHER)
Admission: RE | Admit: 2017-08-02 | Discharge: 2017-08-02 | Disposition: A | Discharge status: Home / Self Care | Payer: Self-pay | Source: Ambulatory Visit | Attending: Gynecologic Oncology | Admitting: Gynecologic Oncology

## 2017-08-02 ENCOUNTER — Encounter (HOSPITAL_BASED_OUTPATIENT_CLINIC_OR_DEPARTMENT_OTHER)
Admission: RE | Disposition: A | Discharge status: Home / Self Care | Payer: Self-pay | Source: Ambulatory Visit | Attending: Gynecologic Oncology

## 2017-08-02 ENCOUNTER — Ambulatory Visit (HOSPITAL_BASED_OUTPATIENT_CLINIC_OR_DEPARTMENT_OTHER): Payer: Self-pay | Admitting: Anesthesiology

## 2017-08-02 DIAGNOSIS — D390 Neoplasm of uncertain behavior of uterus: Secondary | ICD-10-CM

## 2017-08-02 DIAGNOSIS — Z8249 Family history of ischemic heart disease and other diseases of the circulatory system: Secondary | ICD-10-CM | POA: Insufficient documentation

## 2017-08-02 DIAGNOSIS — N888 Other specified noninflammatory disorders of cervix uteri: Secondary | ICD-10-CM | POA: Insufficient documentation

## 2017-08-02 DIAGNOSIS — Z79899 Other long term (current) drug therapy: Secondary | ICD-10-CM | POA: Insufficient documentation

## 2017-08-02 HISTORY — PX: CERVICAL BIOPSY: SHX590

## 2017-08-02 HISTORY — DX: Other specified noninflammatory disorders of cervix uteri: N88.8

## 2017-08-02 HISTORY — DX: Personal history of other diseases of the female genital tract: Z87.42

## 2017-08-02 LAB — POCT PREGNANCY, URINE: Preg Test, Ur: NEGATIVE

## 2017-08-02 SURGERY — EXAM UNDER ANESTHESIA
Anesthesia: Monitor Anesthesia Care

## 2017-08-02 MED ORDER — LACTATED RINGERS IV SOLN
INTRAVENOUS | Status: DC
  Administered 2017-08-02: 08:00:00 via INTRAVENOUS
  Filled 2017-08-02: qty 1000

## 2017-08-02 MED ORDER — MEPERIDINE HCL 25 MG/ML IJ SOLN
6.2500 mg | INTRAMUSCULAR | Status: DC | PRN
  Filled 2017-08-02: qty 1

## 2017-08-02 MED ORDER — ACETAMINOPHEN 160 MG/5ML PO SOLN
325.0000 mg | ORAL | Status: DC | PRN
  Filled 2017-08-02: qty 20.3

## 2017-08-02 MED ORDER — PROPOFOL 10 MG/ML IV BOLUS
INTRAVENOUS | Status: DC | PRN
  Administered 2017-08-02: 50 mg via INTRAVENOUS

## 2017-08-02 MED ORDER — FENTANYL CITRATE (PF) 100 MCG/2ML IJ SOLN
INTRAMUSCULAR | Status: AC
  Filled 2017-08-02: qty 2

## 2017-08-02 MED ORDER — OXYCODONE HCL 5 MG/5ML PO SOLN
5.0000 mg | Freq: Once | ORAL | Status: DC | PRN
  Filled 2017-08-02: qty 5

## 2017-08-02 MED ORDER — KETOROLAC TROMETHAMINE 30 MG/ML IJ SOLN
INTRAMUSCULAR | Status: AC
  Filled 2017-08-02: qty 1

## 2017-08-02 MED ORDER — KETOROLAC TROMETHAMINE 30 MG/ML IJ SOLN
INTRAMUSCULAR | Status: DC | PRN
  Administered 2017-08-02: 30 mg via INTRAVENOUS

## 2017-08-02 MED ORDER — MIDAZOLAM HCL 5 MG/5ML IJ SOLN
INTRAMUSCULAR | Status: DC | PRN
  Administered 2017-08-02: 2 mg via INTRAVENOUS

## 2017-08-02 MED ORDER — ACETAMINOPHEN 325 MG PO TABS
325.0000 mg | ORAL_TABLET | ORAL | Status: DC | PRN
  Filled 2017-08-02: qty 2

## 2017-08-02 MED ORDER — LIDOCAINE 2% (20 MG/ML) 5 ML SYRINGE
INTRAMUSCULAR | Status: AC
  Filled 2017-08-02: qty 5

## 2017-08-02 MED ORDER — OXYCODONE-ACETAMINOPHEN 5-325 MG PO TABS: 1.0000 | ORAL_TABLET | Freq: Four times a day (QID) | ORAL | 0 refills | Status: DC | PRN

## 2017-08-02 MED ORDER — ONDANSETRON HCL 4 MG/2ML IJ SOLN
INTRAMUSCULAR | Status: AC
  Filled 2017-08-02: qty 2

## 2017-08-02 MED ORDER — PROPOFOL 10 MG/ML IV BOLUS
INTRAVENOUS | Status: AC
  Filled 2017-08-02: qty 40

## 2017-08-02 MED ORDER — PROPOFOL 500 MG/50ML IV EMUL
INTRAVENOUS | Status: DC | PRN
  Administered 2017-08-02: 150 ug/kg/min via INTRAVENOUS

## 2017-08-02 MED ORDER — ONDANSETRON HCL 4 MG/2ML IJ SOLN
INTRAMUSCULAR | Status: DC | PRN
  Administered 2017-08-02: 4 mg via INTRAVENOUS

## 2017-08-02 MED ORDER — ARTIFICIAL TEARS OPHTHALMIC OINT
TOPICAL_OINTMENT | OPHTHALMIC | Status: AC
  Filled 2017-08-02: qty 3.5

## 2017-08-02 MED ORDER — MIDAZOLAM HCL 2 MG/2ML IJ SOLN
INTRAMUSCULAR | Status: AC
  Filled 2017-08-02: qty 2

## 2017-08-02 MED ORDER — ONDANSETRON HCL 4 MG/2ML IJ SOLN
4.0000 mg | Freq: Once | INTRAMUSCULAR | Status: DC | PRN
  Filled 2017-08-02: qty 2

## 2017-08-02 MED ORDER — KETOROLAC TROMETHAMINE 30 MG/ML IJ SOLN
30.0000 mg | Freq: Once | INTRAMUSCULAR | Status: DC | PRN
  Filled 2017-08-02: qty 1

## 2017-08-02 MED ORDER — OXYCODONE HCL 5 MG PO TABS
5.0000 mg | ORAL_TABLET | Freq: Once | ORAL | Status: DC | PRN
  Filled 2017-08-02: qty 1

## 2017-08-02 MED ORDER — FENTANYL CITRATE (PF) 100 MCG/2ML IJ SOLN
INTRAMUSCULAR | Status: DC | PRN
  Administered 2017-08-02: 50 ug via INTRAVENOUS

## 2017-08-02 MED ORDER — DEXAMETHASONE SODIUM PHOSPHATE 10 MG/ML IJ SOLN
INTRAMUSCULAR | Status: AC
  Filled 2017-08-02: qty 1

## 2017-08-02 MED ORDER — LIDOCAINE 2% (20 MG/ML) 5 ML SYRINGE
INTRAMUSCULAR | Status: DC | PRN
  Administered 2017-08-02: 60 mg via INTRAVENOUS

## 2017-08-02 MED ORDER — DEXAMETHASONE SODIUM PHOSPHATE 4 MG/ML IJ SOLN
INTRAMUSCULAR | Status: DC | PRN
  Administered 2017-08-02: 10 mg via INTRAVENOUS

## 2017-08-02 MED ORDER — FENTANYL CITRATE (PF) 100 MCG/2ML IJ SOLN
25.0000 ug | INTRAMUSCULAR | Status: DC | PRN
  Filled 2017-08-02: qty 1

## 2017-08-02 SURGICAL SUPPLY — 30 items
APPLICATOR COTTON TIP 6IN STRL (MISCELLANEOUS) IMPLANT
CANISTER SUCT 3000ML PPV (MISCELLANEOUS) IMPLANT
CATH ROBINSON RED A/P 16FR (CATHETERS) ×3 IMPLANT
CLOTH BEACON ORANGE TIMEOUT ST (SAFETY) IMPLANT
COVER BACK TABLE 60X90IN (DRAPES) ×3 IMPLANT
DRAPE LG THREE QUARTER DISP (DRAPES) ×3 IMPLANT
DRAPE UNDERBUTTOCKS STRL (DRAPE) ×3 IMPLANT
DRSG TELFA 3X8 NADH (GAUZE/BANDAGES/DRESSINGS) ×3 IMPLANT
ELECT REM PT RETURN 9FT ADLT (ELECTROSURGICAL) ×3
ELECTRODE REM PT RTRN 9FT ADLT (ELECTROSURGICAL) ×1 IMPLANT
GLOVE BIO SURGEON STRL SZ 6.5 (GLOVE) ×2 IMPLANT
GLOVE BIO SURGEONS STRL SZ 6.5 (GLOVE) ×1
GLOVE INDICATOR 7.0 STRL GRN (GLOVE) ×3 IMPLANT
GOWN STRL REUS W/ TWL LRG LVL3 (GOWN DISPOSABLE) ×1 IMPLANT
GOWN STRL REUS W/TWL LRG LVL3 (GOWN DISPOSABLE) ×2
KIT RM TURNOVER CYSTO AR (KITS) ×3 IMPLANT
LEGGING LITHOTOMY PAIR STRL (DRAPES) ×3 IMPLANT
MANIFOLD NEPTUNE II (INSTRUMENTS) IMPLANT
NEEDLE BIOPSY 14GX4.5 SOFT TIS (NEEDLE) ×3 IMPLANT
NEEDLE SPNL 22GX3.5 QUINCKE BK (NEEDLE) ×3 IMPLANT
NS IRRIG 500ML POUR BTL (IV SOLUTION) ×3 IMPLANT
PENCIL BUTTON HOLSTER BLD 10FT (ELECTRODE) ×3 IMPLANT
SCOPETTES 8  STERILE (MISCELLANEOUS) ×2
SCOPETTES 8 STERILE (MISCELLANEOUS) ×1 IMPLANT
SUT VIC AB 0 CT1 36 (SUTURE) ×3 IMPLANT
SYR CONTROL 10ML LL (SYRINGE) ×3 IMPLANT
TUBE CONNECTING 12'X1/4 (SUCTIONS)
TUBE CONNECTING 12X1/4 (SUCTIONS) IMPLANT
UNDERPAD 30X30 INCONTINENT (UNDERPADS AND DIAPERS) ×3 IMPLANT
YANKAUER SUCT BULB TIP NO VENT (SUCTIONS) ×3 IMPLANT

## 2017-08-02 NOTE — Anesthesia Postprocedure Evaluation (Signed)
Anesthesia Post Note  Patient: Caitlin Bailey  Procedure(s) Performed: EXAM UNDER ANESTHESIA WITH CERVICAL BIOPSY (N/A )     Patient location during evaluation: PACU Anesthesia Type: MAC Level of consciousness: awake Pain management: pain level controlled Vital Signs Assessment: post-procedure vital signs reviewed and stable Respiratory status: spontaneous breathing Cardiovascular status: stable Postop Assessment: no apparent nausea or vomiting Anesthetic complications: no    Last Vitals:  Vitals:   08/02/17 1007 08/02/17 1015  BP:  96/70  Pulse: 65 64  Resp: 13 14  Temp:    SpO2: 100% 100%    Last Pain:  Vitals:   08/02/17 0752  TempSrc:   PainSc: 3    Pain Goal: Patients Stated Pain Goal: 3 (08/02/17 0752)               Alexias Margerum JR,JOHN Mateo Flow

## 2017-08-02 NOTE — Discharge Instructions (Signed)
Do not take any nonsteroidal anti inflammatories (Advil, Ibuprpofen, Motrin, Aleve) until after 3:30 pm today    Post Anesthesia Home Care Instructions  Activity: Get plenty of rest for the remainder of the day. A responsible individual must stay with you for 24 hours following the procedure.  For the next 24 hours, DO NOT: -Drive a car -Paediatric nurse -Drink alcoholic beverages -Take any medication unless instructed by your physician -Make any legal decisions or sign important papers.  Meals: Start with liquid foods such as gelatin or soup. Progress to regular foods as tolerated. Avoid greasy, spicy, heavy foods. If nausea and/or vomiting occur, drink only clear liquids until the nausea and/or vomiting subsides. Call your physician if vomiting continues.  Special Instructions/Symptoms: Your throat may feel dry or sore from the anesthesia or the breathing tube placed in your throat during surgery. If this causes discomfort, gargle with warm salt water. The discomfort should disappear within 24 hours.  If you had a scopolamine patch placed behind your ear for the management of post- operative nausea and/or vomiting:  1. The medication in the patch is effective for 72 hours, after which it should be removed.  Wrap patch in a tissue and discard in the trash. Wash hands thoroughly with soap and water. 2. You may remove the patch earlier than 72 hours if you experience unpleasant side effects which may include dry mouth, dizziness or visual disturbances. 3. Avoid touching the patch. Wash your hands with soap and water after contact with the patch.

## 2017-08-02 NOTE — Anesthesia Procedure Notes (Signed)
Procedure Name: MAC Date/Time: 08/02/2017 9:27 AM Performed by: Lyn Hollingshead, MD Pre-anesthesia Checklist: Patient identified, Timeout performed, Emergency Drugs available, Suction available and Patient being monitored Oxygen Delivery Method: Simple face mask Placement Confirmation: positive ETCO2,  CO2 detector and breath sounds checked- equal and bilateral

## 2017-08-02 NOTE — Interval H&P Note (Signed)
History and Physical Interval Note:  08/02/2017 9:20 AM  [facility] Caitlin Bailey  has presented today for surgery, with the diagnosis of CERVICAL MASS  The various methods of treatment have been discussed with the patient and family. After consideration of risks, benefits and other options for treatment, the patient has consented to  Procedure(s): EXAM UNDER ANESTHESIA WITH CERVICAL BIOPSY (N/A) as a surgical intervention .  The patient's history has been reviewed, patient examined, no change in status, stable for surgery.  I have reviewed the patient's chart and labs.  Questions were answered to the patient's satisfaction.     [place] A.

## 2017-08-02 NOTE — Transfer of Care (Signed)
Last Vitals:  Vitals:   08/02/17 0725 08/02/17 0950  BP: 114/72   Pulse: 81 (P) 67  Resp: 16   Temp: 37.1 C (P) 36.8 C  SpO2: 99% (P) 100%    Last Pain:  Vitals:   08/02/17 0752  TempSrc:   PainSc: 3       Patients Stated Pain Goal: 3 (08/02/17 9688)  Immediate Anesthesia Transfer of Care Note  Patient: Caitlin Bailey  Procedure(s) Performed: Procedure(s) (LRB): EXAM UNDER ANESTHESIA WITH CERVICAL BIOPSY (N/A)  Patient Location: PACU  Anesthesia Type:MAC  Level of Consciousness: awake, alert  and oriented  Airway & Oxygen Therapy: Patient Spontanous Breathing and Patient connected to face mask oxygen  Post-op Assessment: Report given to PACU RN and Post -op Vital signs reviewed and stable  Post vital signs: Reviewed and stable  Complications: No apparent anesthesia complications

## 2017-08-02 NOTE — Op Note (Signed)
PATIENT: Caitlin Bailey DATE OF BIRTH: 09/24/1982 ENCOUNTER DATE: 08/02/2017   Preop Diagnosis: 3 cm multilocular cystic lesion in the cervix.  Postoperative Diagnosis: Same  Surgery: Cervical biopsies  Surgeons:  Imagene Gurney A. Alycia Rossetti, MD  Anesthesia: Sedation  Estimated blood loss: 10 ml  Complications: None   Pathology: Cervical biopsies  Operative findings: Cervix with coxcomb to the anterior lip, multiparous, Uterus normal size and shape. No adnexal masses, freely mobile, no parametrial involvement. Posteriorly at the level of the upper cervix and lower uterine segment was a 3 cm firm mass. Smooth.  Procedure: The patient was identified in the preoperative holding area. Informed consent was signed on the chart. Patient was seen history was reviewed and exam was performed. Urine pregnancy test was negative  The patient was then taken to the operating room and placed in the supine position with SCD hose on. Conscious sedation was induced without difficulty. She was then placed in the dorsolithotomy position. The vagina was prepped with Betadine.  Timeout was performed the patient, procedure, antibiotic, allergy, and length of procedure. The posterior lip of the cervix was grasped with a single tooth tenaculum. Using a true cut biopsy, 4 passes were made through the posterior wall of the cervix into the mass. The mass was very firm, small tissue and blood was removed. Hemostasis was obtained by holding pressure.  All instrument, Ray-Tec, and needle counts were correct x2. The patient tolerated the procedure well and was taken recovery room in stable condition. This is Nancy Marus dictating an operative note on [facility] ([facility]).

## 2017-08-02 NOTE — Anesthesia Preprocedure Evaluation (Signed)
Anesthesia Evaluation  Patient identified by MRN, date of birth, ID band Patient awake    Reviewed: Allergy & Precautions, NPO status , Patient's Chart, lab work & pertinent test results  Airway Mallampati: I       Dental no notable dental hx. (+) Teeth Intact   Pulmonary neg pulmonary ROS,    Pulmonary exam normal breath sounds clear to auscultation       Cardiovascular Normal cardiovascular exam Rhythm:Regular Rate:Normal     Neuro/Psych    GI/Hepatic negative GI ROS, Neg liver ROS,   Endo/Other  negative endocrine ROS  Renal/GU negative Renal ROS     Musculoskeletal negative musculoskeletal ROS (+)   Abdominal Normal abdominal exam  (+)   Peds  Hematology negative hematology ROS (+)   Anesthesia Other Findings   Reproductive/Obstetrics negative OB ROS                             Anesthesia Physical Anesthesia Plan  ASA: I  Anesthesia Plan: MAC   Post-op Pain Management:    Induction:   PONV Risk Score and Plan: 3 and Ondansetron and Dexamethasone  Airway Management Planned: Natural Airway, Nasal Cannula and Simple Face Mask  Additional Equipment:   Intra-op Plan:   Post-operative Plan:   Informed Consent: I have reviewed the patients History and Physical, chart, labs and discussed the procedure including the risks, benefits and alternatives for the proposed anesthesia with the patient or authorized representative who has indicated his/her understanding and acceptance.     Plan Discussed with: CRNA and Surgeon  Anesthesia Plan Comments:         Anesthesia Quick Evaluation

## 2017-08-02 NOTE — H&P (Signed)
Consult Note: Gyn-Onc  Caitlin Bailey 37 y.o. female  CC:     Chief Complaint  Patient presents with  . Cervical mass    HPI: Patient is seen today in consultation at the request of Dr. Ilda Basset.   Patient is a very pleasant 37 year old gravida 2 para 2 who underwent an appendectomy and a right ovarian cystectomy 03/15/2017. Patient had presented to the ED on 8/29 for abdominal pain and had CT scan that showed appendicitis. Also 4-5cm multi cystic "cervical mass" noted. Pt taken back for OR and had an 8/29 laparoscopic appy and partial right oophorectomy.  Pathology was consistent with an inflamed appendix. The ovarian cyst had a hemorrhagic cyst with no evidence of malignancy. Should a Pap smear 03/29/2017. Her Pap smear was normal with negative HR-HPV. She had been referred by [facility] to Dr. Ilda Basset secondary to CT findings on her cervix.  CT 8/18: REPRODUCTIVE: Multi-cystic cervical mass measuring 5.3 x 4 cm  extending into the upper vaginal vault. 15 mm fluid distended  endometrium.  OTHER: No intraperitoneal free fluid or free air.  IMPRESSION:  1. Acute tip appendicitis.  2. Multi cystic 5.3 x 4 cm cervical mass. Differential diagnosis includes tunnel cluster Nabothian cysts or, neoplasm.  U/S 9/18: Uterus Measurements: 10.0 x 4.3 x 5.7 cm. Numerous small cystic spaces noted within the prominent cervix corresponding to the mass seen on CT.  Endometrium:Thickness: 11 mm in thickness. No focal abnormality visualized.  Right ovary :Measurements: 2.1 x 1.3 x 2.1 cm. Normal appearance/no adnexal mass.  Left ovary: Measurements: 2.4 x 2.2 x 2.5 cm. Normal appearance/no adnexal mass. IMPRESSION: Numerous dilated cystic spaces noted within a prominent cervix as seen on prior CT. This does not have the typical appearance of Nabothian cysts. I would recommend further evaluation with MRI with and without contrast.  The patient had several  obstetrical ultrasound which we were able to get reports of from 2013. At that time there was no comment about any abnormal cystic area on her cervix.   She states that for about the last 2 months she has had some discomfort with intercourse as well as some feeling of pressure when she walks at work. It is mild. There is no post total bleeding. Her last cycle was October 30 and irregular. Her husband uses condoms. She denies any change in her bowel or bladder habits or any nausea vomiting. She denies any fevers or chills. She is a 62-year-old son and a 60-year-old daughter. Her son was over 10 pounds and she had significant obstructive tearing. She had 2 spontaneous vaginal deliveries. The only cancer in her family is a maternal great aunt who had breast cancer but she was quite a bit older. She does have a niece who had a lump removed in her breast. This was done in [place] and she does not know if it was benign or malignant.  Current Meds:      Outpatient Encounter Medications as of 06/14/2017  Medication Sig  . acetaminophen (TYLENOL) 325 MG tablet Take 2 tablets (650 mg total) by mouth every 6 (six) hours as needed for mild pain or fever.  Marland Kitchen ibuprofen (ADVIL,MOTRIN) 200 MG tablet You can take 2 or 3 tablets every 6 hours as needed for pain. You can alternate this with plain Tylenol or the oxycodone. You can purchase this over-the-counter at any drugstore.  . meclizine (ANTIVERT) 25 MG tablet Take 1 tablet (25 mg total) by mouth 3 (three) times daily  as needed. (Patient not taking: Reported on 05/24/2017)  . oxyCODONE (OXY IR/ROXICODONE) 5 MG immediate release tablet Take 1-2 tablets (5-10 mg total) by mouth every 4 (four) hours as needed for moderate pain. (Patient not taking: Reported on 03/27/2017)  . SUMAtriptan (IMITREX) 25 MG tablet Take 1 tablet (25 mg total) by mouth every 2 (two) hours as needed for migraine. May repeat in 2 hours if headache persists or recurs.  . topiramate (TOPAMAX) 25 MG  capsule Take 1 capsule (25 mg total) by mouth daily. (Patient not taking: Reported on 05/24/2017)  . traMADol (ULTRAM) 50 MG tablet Take 1 tablet (50 mg total) by mouth every 8 (eight) hours as needed. (Patient not taking: Reported on 05/24/2017)   No facility-administered encounter medications on file as of 06/14/2017.     Allergy: No Known Allergies  Social Hx:   Social History        Socioeconomic History  . Marital status: Single    Spouse name: Not on file  . Number of children: Not on file  . Years of education: Not on file  . Highest education level: Not on file  Social Needs  . Financial resource strain: Not on file  . Food insecurity - worry: Not on file  . Food insecurity - inability: Not on file  . Transportation needs - medical: Not on file  . Transportation needs - non-medical: Not on file  Occupational History  . Not on file  Tobacco Use  . Smoking status: Never Smoker  . Smokeless tobacco: Never Used  Substance and Sexual Activity  . Alcohol use: No  . Drug use: No  . Sexual activity: Yes    Birth control/protection: Condom  Other Topics Concern  . Not on file  Social History Narrative  . Not on file    Past Surgical Hx:       Past Surgical History:  Procedure Laterality Date  . APPENDECTOMY  03/15/2017  . CYST EXCISION Left 2000s   "groin/inner thigh"  . LAPAROSCOPIC APPENDECTOMY N/A 03/15/2017   Procedure: APPENDECTOMY LAPAROSCOPIC;  Surgeon: Judeth Horn, MD;  Location: [facility];  Service: General;  Laterality: N/A;  . OVARIAN CYST REMOVAL  2009   ex-lap    Past Medical Hx:      Past Medical History:  Diagnosis Date  . Acute appendicitis 03/15/2017  . Depression    took meds after mother's passing  . Family history of adverse reaction to anesthesia    hyperthermia; pt denies knowledge of this on 03/15/2017  . Migraine    "too offen when I get too stressed" (03/15/2017)  . Uterine fibroids affecting pregnancy      Oncology Hx:   No history exists.    Family Hx:       Family History  Problem Relation Age of Onset  . Heart disease Mother   . Diabetes Father   . Cancer Paternal Aunt     Vitals:  Blood pressure 112/76, pulse 78, temperature 98.6 F (37 C), temperature source Oral, resp. rate 18, height 5\' 1"  (1.549 m), weight 138 lb 11.2 oz (62.9 kg), last menstrual period 05/15/2017, SpO2 100 %, unknown if currently breastfeeding.  Physical Exam: Well-nourished, well-developed female in no acute distress.  Neck: Supple, no lymphadenopathy, no thyromegaly.  Lungs: Clear to auscultation bilaterally  Cardiac: Regular rate and rhythm  Abdomen: Well-healed surgical incisions. Abdomen is soft, nontender, nondistended. There are no palpable masses or prostatomegaly. There is no evidence of any incisional hernias.  Groins: No lymphadenopathy.  Extremities: No edema.  Pelvic: External genitalia is normal. There does appear to be 2 skin tags bilaterally at the level of the hymen that measure approximately a centimeter in length. She does have fairly significant pelvic relaxation with lateral wall relaxation. The cervix is normal but somewhat breathless. The anterior lip is in a Coxcomb shape. There are no gross visible lesions. Bimanual examination does reveal a smooth mass posterior to the level of the posterior cervix/lower uterine segment. The uterus is of normal size shape and consistency. There are no adnexal masses.  Assessment/Plan: 37 year old with a cystic area in the posterior cervix does not appear to be particularly worrisome on imaging. She's had both CT scans and ultrasounds. Ultrasound did recommend MRI which I have ordered. We will get an MRI of the pelvis with the results. Her Pap smear cytology was normal. There are no visible lesions to biopsy. Pending the results of the MRI we may or may not proceed with an exam under anesthesia and FNA of the posterior  cervix.  She does have fairly significant pelvic relaxation for her age. She did have a baby that was over 10 pounds. We did discuss Kegel exercises. Ultimately she may wish to have reconstructive surgery however she and her husband are not sure that they have completed their family.  I appreciate the opportunity to partner in the care of this very pleasant patient.  Nancy Marus A., MD

## 2017-08-03 LAB — POCT HEMOGLOBIN-HEMACUE: HEMOGLOBIN: 14.8 g/dL (ref 12.0–15.0)

## 2017-08-08 ENCOUNTER — Telehealth: Payer: Self-pay | Admitting: *Deleted

## 2017-08-08 NOTE — Telephone Encounter (Signed)
Contacted Almyra Free the interpreter, she is going to call the patient and notify her that Dr. Skeet Latch will not be seeing her on January 31st. Dr. Denman George to see the patient

## 2017-08-17 ENCOUNTER — Encounter: Payer: Self-pay | Admitting: Gynecologic Oncology

## 2017-08-17 ENCOUNTER — Inpatient Hospital Stay: Payer: Self-pay | Attending: Gynecologic Oncology | Admitting: Gynecologic Oncology

## 2017-08-17 VITALS — BP 112/65 | HR 76 | Temp 98.5°F | Resp 18 | Wt 138.7 lb

## 2017-08-17 DIAGNOSIS — N888 Other specified noninflammatory disorders of cervix uteri: Secondary | ICD-10-CM

## 2017-08-17 DIAGNOSIS — D260 Other benign neoplasm of cervix uteri: Secondary | ICD-10-CM | POA: Insufficient documentation

## 2017-08-17 NOTE — Progress Notes (Signed)
Consult Note: Gyn-Onc  Caitlin Bailey 37 y.o. female  CC:  Chief Complaint  Patient presents with  . Cervical mass   Assessment/Plan: 37 year old with a 3cm benign fibrovascular cystic mass of the cervix. It is asymptomatic and benign therefore no surgical intervention is necessary at this time.  She will follow-up with Dr Ilda Basset for routine annual well woman care.   HPI: Patient was seen in consultation at the request of Dr. Ilda Basset.   Patient is a very pleasant 37 year old gravida 2 para 2 who underwent an appendectomy and a right ovarian cystectomy 03/15/2017. Patient had presented to the ED on 8/29 for abdominal pain and had CT scan that showed appendicitis. Also 4-5cm multi cystic "cervical mass" noted. Pt taken back for OR and had an 8/29 laparoscopic appy and partial right oophorectomy.  Pathology was consistent with an inflamed appendix. The ovarian cyst had a hemorrhagic cyst with no evidence of malignancy. Should a Pap smear 03/29/2017. Her Pap smear was normal with negative HR-HPV. She had been referred by [facility] to Dr. Ilda Basset secondary to CT findings on her cervix.  CT 8/18: REPRODUCTIVE: Multi-cystic cervical mass measuring 5.3 x 4 cm  extending into the upper vaginal vault. 15 mm fluid distended  endometrium.  OTHER: No intraperitoneal free fluid or free air.  IMPRESSION:  1. Acute tip appendicitis.  2. Multi cystic 5.3 x 4 cm cervical mass. Differential diagnosis includes tunnel cluster Nabothian cysts or, neoplasm.  U/S 9/18: Uterus Measurements: 10.0 x 4.3 x 5.7 cm. Numerous small cystic spaces noted within the prominent cervix corresponding to the mass seen on CT.  Endometrium:Thickness: 11 mm in thickness.  No focal abnormality visualized.  Right ovary :Measurements: 2.1 x 1.3 x 2.1 cm. Normal appearance/no adnexal mass.  Left ovary: Measurements: 2.4 x 2.2 x 2.5 cm. Normal appearance/no adnexal mass. IMPRESSION: Numerous  dilated cystic spaces noted within a prominent cervix as seen on prior CT. This does not have the typical appearance of Nabothian cysts. I would recommend further evaluation with MRI with and without contrast.  The patient had several obstetrical ultrasound which we were able to get reports of from 2013. At that time there was no comment about any abnormal cystic area on her cervix.   She states that for about the last 2 months she has had some discomfort with intercourse as well as some feeling of pressure when she walks at work. It is mild. There is no post total bleeding. Her last cycle was October 30 and irregular. Her husband uses condoms. She denies any change in her bowel or bladder habits or any nausea vomiting. She denies any fevers or chills. She is a 53-year-old son and a 52-year-old daughter. Her son was over 10 pounds and she had significant obstructive tearing. She had 2 spontaneous vaginal deliveries. The only cancer in her family is a maternal great aunt who had breast cancer but she was quite a bit older. She does have a niece who had a lump removed in her breast. This was done in [place] and she does not know if it was benign or malignant.  MRI 06/21/17: The uterus measured 7.5 x 4.2 x 5.1 cm with a 6 cm endometrium.  This area within the cervix measured 3.1 x 2.5 x 3.0 cm and showed numerous enhancing internal septations but no definite solid nodule component.  No evidence of extrauterine extension into the parametrium.  Interval Hx: On 08/02/17 she underwent an exam under anesthesia with cervical  biopsy with Dr Nancy Marus which revealed benign fibrovascular stroma from the mass.   Since the procedure she has done well with no complaints.   Current Meds:  Outpatient Encounter Medications as of 08/17/2017  Medication Sig  . [DISCONTINUED] oxyCODONE-acetaminophen (PERCOCET/ROXICET) 5-325 MG tablet Take 1 tablet by mouth every 6 (six) hours as needed for severe pain.   No  facility-administered encounter medications on file as of 08/17/2017.     Allergy: No Known Allergies  Social Hx:   Social History   Socioeconomic History  . Marital status: Single    Spouse name: Not on file  . Number of children: Not on file  . Years of education: Not on file  . Highest education level: Not on file  Social Needs  . Financial resource strain: Not on file  . Food insecurity - worry: Not on file  . Food insecurity - inability: Not on file  . Transportation needs - medical: Not on file  . Transportation needs - non-medical: Not on file  Occupational History  . Not on file  Tobacco Use  . Smoking status: Never Smoker  . Smokeless tobacco: Never Used  Substance and Sexual Activity  . Alcohol use: No  . Drug use: No  . Sexual activity: Yes    Birth control/protection: Condom  Other Topics Concern  . Not on file  Social History Narrative  . Not on file    Past Surgical Hx:  Past Surgical History:  Procedure Laterality Date  . CYST EXCISION Left 2000s   "groin/inner thigh"  . LAPAROSCOPIC APPENDECTOMY N/A 03/15/2017   Procedure: APPENDECTOMY LAPAROSCOPIC;  Surgeon: Judeth Horn, MD;  Location: [facility];  Service: General;  Laterality: N/A;  and RIGHT OVARIAN CYSTECTOMY  . OVARIAN CYST REMOVAL  2009   ex-lap    Past Medical Hx:  Past Medical History:  Diagnosis Date  . Cervical mass   . Depression   . History of ovarian cyst 03/15/2017   post right ovarian cystectomy-- per path benign  . Migraine     Oncology Hx:   No history exists.    Family Hx:  Family History  Problem Relation Age of Onset  . Heart disease Mother   . Diabetes Father   . Cancer Paternal Aunt     Vitals:  Blood pressure 112/65, pulse 76, temperature 98.5 F (36.9 C), temperature source Oral, resp. rate 18, weight 138 lb 11.2 oz (62.9 kg), SpO2 97 %.  Physical Exam: Well-nourished, well-developed female in no acute distress.  Neck: Supple, no lymphadenopathy, no  thyromegaly.  Lungs: Clear to auscultation bilaterally  Cardiac: Regular rate and rhythm  Abdomen: Well-healed surgical incisions. Abdomen is soft, nontender, nondistended. There are no palpable masses or prostatomegaly. There is no evidence of any incisional hernias.  Groins: No lymphadenopathy.  Extremities: No edema.  Pelvic: External genitalia is normal. There does appear to be 2 skin tags bilaterally at the level of the hymen that measure approximately a centimeter in length. She does have fairly significant pelvic relaxation with lateral wall relaxation. The cervix is normal but somewhat breathless. The anterior lip is in a Coxcomb shape. There are no gross visible lesions. Bimanual examination does reveal a smooth mass posterior to the level of the posterior cervix/lower uterine segment. The uterus is of normal size shape and consistency. There are no adnexal masses.  Assessment/Plan: 37 year old with a cystic area in the posterior cervix does not appear to be particularly worrisome on imaging. She's had both CT  scans and ultrasounds. Ultrasound did recommend MRI which I have ordered. We will get an MRI of the pelvis with the results. Her Pap smear cytology was normal. There are no visible lesions to biopsy. Pending the results of the MRI we may or may not proceed with an exam under anesthesia and FNA of the posterior cervix.  She does have fairly significant pelvic relaxation for her age. She did have a baby that was over 10 pounds. We did discuss Kegel exercises. Ultimately she may wish to have reconstructive surgery however she and her husband are not sure that they have completed their family.  I appreciate the opportunity to partner in the care of this very pleasant patient.  Donaciano Eva, MD 08/17/2017, 3:41 PMConsult Note: Gyn-Onc  Caitlin Bailey 37 y.o. female  CC:  No chief complaint on file.   HPI: Patient is seen today in consultation at the  request of Dr. Ilda Basset.   Patient is a very pleasant 37 year old gravida 2 para 2 who underwent an appendectomy and a right ovarian cystectomy 03/15/2017. Patient had presented to the ED on 8/29 for abdominal pain and had CT scan that showed appendicitis. Also 4-5cm multi cystic "cervical mass" noted. Pt taken back for OR and had an 8/29 laparoscopic appy and partial right oophorectomy.  Pathology was consistent with an inflamed appendix. The ovarian cyst had a hemorrhagic cyst with no evidence of malignancy. Should a Pap smear 03/29/2017. Her Pap smear was normal with negative HR-HPV. She had been referred by [facility] to Dr. Ilda Basset secondary to CT findings on her cervix.  CT 8/18: REPRODUCTIVE: Multi-cystic cervical mass measuring 5.3 x 4 cm  extending into the upper vaginal vault. 15 mm fluid distended  endometrium.  OTHER: No intraperitoneal free fluid or free air.  IMPRESSION:  1. Acute tip appendicitis.  2. Multi cystic 5.3 x 4 cm cervical mass. Differential diagnosis includes tunnel cluster Nabothian cysts or, neoplasm.  U/S 9/18: Uterus Measurements: 10.0 x 4.3 x 5.7 cm. Numerous small cystic spaces noted within the prominent cervix corresponding to the mass seen on CT.  Endometrium:Thickness: 11 mm in thickness.  No focal abnormality visualized.  Right ovary :Measurements: 2.1 x 1.3 x 2.1 cm. Normal appearance/no adnexal mass.  Left ovary: Measurements: 2.4 x 2.2 x 2.5 cm. Normal appearance/no adnexal mass. IMPRESSION: Numerous dilated cystic spaces noted within a prominent cervix as seen on prior CT. This does not have the typical appearance of Nabothian cysts. I would recommend further evaluation with MRI with and without contrast.  The patient had several obstetrical ultrasound which we were able to get reports of from 2013. At that time there was no comment about any abnormal cystic area on her cervix.   She states that for about the last 2 months she has had  some discomfort with intercourse as well as some feeling of pressure when she walks at work. It is mild. There is no post total bleeding. Her last cycle was October 30 and irregular. Her husband uses condoms. She denies any change in her bowel or bladder habits or any nausea vomiting. She denies any fevers or chills. She is a 76-year-old son and a 25-year-old daughter. Her son was over 10 pounds and she had significant obstructive tearing. She had 2 spontaneous vaginal deliveries. The only cancer in her family is a maternal great aunt who had breast cancer but she was quite a bit older. She does have a niece who had a lump  removed in her breast. This was done in [place] and she does not know if it was benign or malignant.  Current Meds:  Outpatient Encounter Medications as of 08/17/2017  Medication Sig  . oxyCODONE-acetaminophen (PERCOCET/ROXICET) 5-325 MG tablet Take 1 tablet by mouth every 6 (six) hours as needed for severe pain.   No facility-administered encounter medications on file as of 08/17/2017.     Allergy: No Known Allergies  Social Hx:   Social History   Socioeconomic History  . Marital status: Single    Spouse name: Not on file  . Number of children: Not on file  . Years of education: Not on file  . Highest education level: Not on file  Social Needs  . Financial resource strain: Not on file  . Food insecurity - worry: Not on file  . Food insecurity - inability: Not on file  . Transportation needs - medical: Not on file  . Transportation needs - non-medical: Not on file  Occupational History  . Not on file  Tobacco Use  . Smoking status: Never Smoker  . Smokeless tobacco: Never Used  Substance and Sexual Activity  . Alcohol use: No  . Drug use: No  . Sexual activity: Yes    Birth control/protection: Condom  Other Topics Concern  . Not on file  Social History Narrative  . Not on file    Past Surgical Hx:  Past Surgical History:  Procedure Laterality Date  . CYST  EXCISION Left 2000s   "groin/inner thigh"  . LAPAROSCOPIC APPENDECTOMY N/A 03/15/2017   Procedure: APPENDECTOMY LAPAROSCOPIC;  Surgeon: Judeth Horn, MD;  Location: [facility];  Service: General;  Laterality: N/A;  and RIGHT OVARIAN CYSTECTOMY  . OVARIAN CYST REMOVAL  2009   ex-lap    Past Medical Hx:  Past Medical History:  Diagnosis Date  . Cervical mass   . Depression   . History of ovarian cyst 03/15/2017   post right ovarian cystectomy-- per path benign  . Migraine     Oncology Hx:   No history exists.    Family Hx:  Family History  Problem Relation Age of Onset  . Heart disease Mother   . Diabetes Father   . Cancer Paternal Aunt     Vitals:  There were no vitals taken for this visit.  Physical Exam: Well-nourished, well-developed female in no acute distress.  Neck: Supple, no lymphadenopathy, no thyromegaly.  Lungs: Clear to auscultation bilaterally  Cardiac: Regular rate and rhythm  Abdomen: Well-healed surgical incisions. Abdomen is soft, nontender, nondistended. There are no palpable masses or prostatomegaly. There is no evidence of any incisional hernias.  Groins: No lymphadenopathy.  Extremities: No edema.  Pelvic: External genitalia is normal. There does appear to be 2 skin tags bilaterally at the level of the hymen that measure approximately a centimeter in length. She does have fairly significant pelvic relaxation with lateral wall relaxation. The cervix is normal but somewhat breathless. The anterior lip is in a Coxcomb shape. There are no gross visible lesions. Bimanual examination does reveal a smooth mass posterior to the level of the posterior cervix/lower uterine segment. The uterus is of normal size shape and consistency. There are no adnexal masses.   Donaciano Eva, MD 08/17/2017, 12:50 PM

## 2017-08-17 NOTE — Patient Instructions (Signed)
Please follow-up with your OBGYN provider for well woman care. You have a benign (noncancerous) cyst of the cervix. It does not require treatment.  It will be unlikely to resolve on its own but does not need intervention if it does not give you symptoms.

## 2018-08-10 ENCOUNTER — Other Ambulatory Visit: Payer: Self-pay

## 2018-08-10 ENCOUNTER — Emergency Department (HOSPITAL_COMMUNITY)
Admission: EM | Admit: 2018-08-10 | Discharge: 2018-08-11 | Disposition: A | Discharge status: Home / Self Care | Payer: Self-pay | Attending: Emergency Medicine | Admitting: Emergency Medicine

## 2018-08-10 ENCOUNTER — Encounter (HOSPITAL_COMMUNITY): Payer: Self-pay | Admitting: Emergency Medicine

## 2018-08-10 DIAGNOSIS — R51 Headache: Secondary | ICD-10-CM | POA: Insufficient documentation

## 2018-08-10 DIAGNOSIS — R42 Dizziness and giddiness: Secondary | ICD-10-CM | POA: Insufficient documentation

## 2018-08-10 DIAGNOSIS — S0990XA Unspecified injury of head, initial encounter: Secondary | ICD-10-CM | POA: Insufficient documentation

## 2018-08-10 DIAGNOSIS — W208XXA Other cause of strike by thrown, projected or falling object, initial encounter: Secondary | ICD-10-CM | POA: Insufficient documentation

## 2018-08-10 DIAGNOSIS — M25511 Pain in right shoulder: Secondary | ICD-10-CM | POA: Insufficient documentation

## 2018-08-10 DIAGNOSIS — R0789 Other chest pain: Secondary | ICD-10-CM | POA: Insufficient documentation

## 2018-08-10 DIAGNOSIS — Y990 Civilian activity done for income or pay: Secondary | ICD-10-CM | POA: Insufficient documentation

## 2018-08-10 DIAGNOSIS — R110 Nausea: Secondary | ICD-10-CM | POA: Insufficient documentation

## 2018-08-10 DIAGNOSIS — R1084 Generalized abdominal pain: Secondary | ICD-10-CM | POA: Insufficient documentation

## 2018-08-10 DIAGNOSIS — Y9289 Other specified places as the place of occurrence of the external cause: Secondary | ICD-10-CM | POA: Insufficient documentation

## 2018-08-10 DIAGNOSIS — Y9389 Activity, other specified: Secondary | ICD-10-CM | POA: Insufficient documentation

## 2018-08-10 NOTE — ED Triage Notes (Signed)
Pt sent from [facility].  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  Hit R side of her body.  Denies fall.  C/o pain to R side of head, R ear, R ribs, and R arm.  C/o dizziness.  Denies LOC.  Denies numbness, tingling, and loss of sensation. Given Toradol at [facility].

## 2018-08-11 ENCOUNTER — Emergency Department (HOSPITAL_COMMUNITY): Payer: Self-pay

## 2018-08-11 LAB — CBC WITH DIFFERENTIAL/PLATELET
Abs Immature Granulocytes: 0.02 10*3/uL (ref 0.00–0.07)
Basophils Absolute: 0.1 10*3/uL (ref 0.0–0.1)
Basophils Relative: 1 %
Eosinophils Absolute: 0.2 10*3/uL (ref 0.0–0.5)
Eosinophils Relative: 3 %
HCT: 39.5 % (ref 36.0–46.0)
Hemoglobin: 12.6 g/dL (ref 12.0–15.0)
IMMATURE GRANULOCYTES: 0 %
Lymphocytes Relative: 44 %
Lymphs Abs: 3.1 10*3/uL (ref 0.7–4.0)
MCH: 29.9 pg (ref 26.0–34.0)
MCHC: 31.9 g/dL (ref 30.0–36.0)
MCV: 93.8 fL (ref 80.0–100.0)
Monocytes Absolute: 0.4 10*3/uL (ref 0.1–1.0)
Monocytes Relative: 6 %
Neutro Abs: 3.2 10*3/uL (ref 1.7–7.7)
Neutrophils Relative %: 46 %
Platelets: 244 10*3/uL (ref 150–400)
RBC: 4.21 MIL/uL (ref 3.87–5.11)
RDW: 13.4 % (ref 11.5–15.5)
WBC: 7 10*3/uL (ref 4.0–10.5)
nRBC: 0 % (ref 0.0–0.2)

## 2018-08-11 LAB — I-STAT BETA HCG BLOOD, ED (MC, WL, AP ONLY): I-stat hCG, quantitative: <5 m[IU]/mL (ref <5)

## 2018-08-11 LAB — BASIC METABOLIC PANEL
ANION GAP: 9 (ref 5–15)
BUN: 21 mg/dL — ABNORMAL HIGH (ref 6–20)
CO2: 23 mmol/L (ref 22–32)
Calcium: 8.9 mg/dL (ref 8.9–10.3)
Chloride: 103 mmol/L (ref 98–111)
Creatinine, Ser: 0.63 mg/dL (ref 0.44–1.00)
GFR calc Af Amer: >60 mL/min (ref >60)
GFR calc non Af Amer: >60 mL/min (ref >60)
Glucose, Bld: 101 mg/dL — ABNORMAL HIGH (ref 70–99)
Potassium: 3.9 mmol/L (ref 3.5–5.1)
SODIUM: 135 mmol/L (ref 135–145)

## 2018-08-11 MED ORDER — IOHEXOL 300 MG/ML  SOLN
100.0000 mL | Freq: Once | INTRAMUSCULAR | Status: AC | PRN
  Administered 2018-08-11: 100 mL via INTRAVENOUS

## 2018-08-11 MED ORDER — FENTANYL CITRATE (PF) 100 MCG/2ML IJ SOLN
50.0000 ug | Freq: Once | INTRAMUSCULAR | Status: AC
  Administered 2018-08-11: 50 ug via INTRAVENOUS
  Filled 2018-08-11: qty 2

## 2018-08-11 NOTE — ED Provider Notes (Signed)
[facility] EMERGENCY DEPARTMENT Provider Note   CSN: [id number] Arrival date & time: 08/10/18  2030     History   Chief Complaint Chief Complaint  Patient presents with  . Head Injury    HPI [facility] Tera Mater is a 38 y.o. female.  Level 5 caveat for language barrier.  Translator used.  Patient states she works at [employer] and had a "bed frame" that weighs 35 to 45 pounds fall and strike her in the right side of the head as well as shoulder arm and ribs.  She was "stunned" but not knocked out.  She was seen in urgent care had a negative chest x-ray and sent to the ED.  She complains of severe headache with nausea and dizziness.  Denies any blurry vision or double vision. No focal weakness, numbness, tingling. C/o pain to R shoulder, ribs, abdomen as well. No CP or SOB. No pain with eye movement.   The history is provided by the patient.  Head Injury  Associated symptoms: headache   Associated symptoms: no nausea, no neck pain and no vomiting     Past Medical History:  Diagnosis Date  . Cervical mass   . Depression   . History of ovarian cyst 03/15/2017   post right ovarian cystectomy-- per path benign  . Migraine     Patient Active Problem List   Diagnosis Date Noted  . Cervical cyst 05/24/2017  . Language barrier 05/24/2017  . History of appendectomy 04/28/2017  . Tinnitus, bilateral 04/28/2017  . Vertigo 04/28/2017    Past Surgical History:  Procedure Laterality Date  . CYST EXCISION Left 2000s   "groin/inner thigh"  . LAPAROSCOPIC APPENDECTOMY N/A 03/15/2017   Procedure: APPENDECTOMY LAPAROSCOPIC;  Surgeon: Judeth Horn, MD;  Location: [facility];  Service: General;  Laterality: N/A;  and RIGHT OVARIAN CYSTECTOMY  . OVARIAN CYST REMOVAL  2009   ex-lap     OB History    Gravida  2   Para  2   Term  2   Preterm  0   AB  0   Living  2     SAB  0   TAB  0   Ectopic  0   Multiple  0   Live Births  2             Home Medications    Prior to Admission medications   Not on File    Family History Family History  Problem Relation Age of Onset  . Heart disease Mother   . Diabetes Father   . Cancer Paternal Aunt     Social History Social History   Tobacco Use  . Smoking status: Never Smoker  . Smokeless tobacco: Never Used  Substance Use Topics  . Alcohol use: No  . Drug use: No     Allergies   Patient has no known allergies.   Review of Systems Review of Systems  Constitutional: Negative for activity change, appetite change and fever.  HENT: Negative for congestion.   Respiratory: Negative for cough, chest tightness and shortness of breath.   Cardiovascular: Negative for chest pain.  Gastrointestinal: Negative for abdominal pain, nausea and vomiting.  Genitourinary: Negative for dysuria, hematuria, vaginal bleeding and vaginal discharge.  Musculoskeletal: Positive for arthralgias and myalgias. Negative for back pain and neck pain.  Skin: Negative for rash.  Neurological: Positive for dizziness, light-headedness and headaches. Negative for weakness.   all other systems are negative except as  noted in the HPI and PMH.  '  Physical Exam Updated Vital Signs BP 114/69 (BP Location: Left Arm)   Pulse 72   Temp 98.5 F (36.9 C) (Oral)   Resp 16   LMP 07/22/2018   SpO2 100%   Physical Exam Vitals signs and nursing note reviewed.  Constitutional:      General: She is not in acute distress.    Appearance: She is well-developed.  HENT:     Head: Normocephalic and atraumatic.     Comments: No malocclusion or trismus.  No septal hematoma or hemotympanum.  There is tenderness to palpation of right temple.    Right Ear: Tympanic membrane and ear canal normal.     Left Ear: Tympanic membrane and ear canal normal.     Mouth/Throat:     Pharynx: No oropharyngeal exudate.  Eyes:     Conjunctiva/sclera: Conjunctivae normal.     Pupils: Pupils are equal, round, and reactive  to light.  Neck:     Musculoskeletal: Normal range of motion and neck supple. No muscular tenderness.     Comments: No C-spine tenderness Cardiovascular:     Rate and Rhythm: Normal rate and regular rhythm.     Heart sounds: Normal heart sounds. No murmur.  Pulmonary:     Effort: Pulmonary effort is normal. No respiratory distress.     Breath sounds: Normal breath sounds.     Comments: Right rib tenderness without crepitance Chest:     Chest wall: Tenderness present.  Abdominal:     Palpations: Abdomen is soft.     Tenderness: There is no abdominal tenderness. There is no guarding or rebound.     Comments: Diffuse abdominal tenderness without ecchymosis.  Voluntary guarding throughout  Musculoskeletal: Normal range of motion.        General: Tenderness present.     Comments: No T or L-spine tenderness  Ecchymosis to right upper arm and shoulder without bony tenderness.  Distal radial pulse intact.  Skin:    General: Skin is warm.  Neurological:     General: No focal deficit present.     Mental Status: She is alert and oriented to person, place, and time. Mental status is at baseline.     Cranial Nerves: No cranial nerve deficit.     Motor: No abnormal muscle tone.     Coordination: Coordination normal.     Comments: No ataxia on finger to nose bilaterally. No pronator drift. 5/5 strength throughout. CN 2-12 intact.Equal grip strength. Sensation intact.   Psychiatric:        Behavior: Behavior normal.      ED Treatments / Results  Labs (all labs ordered are listed, but only abnormal results are displayed) Labs Reviewed  BASIC METABOLIC PANEL - Abnormal; Notable for the following components:      Result Value   Glucose, Bld 101 (*)    BUN 21 (*)    All other components within normal limits  CBC WITH DIFFERENTIAL/PLATELET  I-STAT BETA HCG BLOOD, ED ([facility], [facility], [facility] ONLY)    EKG None  Radiology Ct Head Wo Contrast  Result Date: 08/11/2018 CLINICAL DATA:  Head trauma  EXAM: CT HEAD WITHOUT CONTRAST CT MAXILLOFACIAL WITHOUT CONTRAST CT CERVICAL SPINE WITHOUT CONTRAST TECHNIQUE: Multidetector CT imaging of the head, cervical spine, and maxillofacial structures were performed using the standard protocol without intravenous contrast. Multiplanar CT image reconstructions of the cervical spine and maxillofacial structures were also generated. COMPARISON:  None. FINDINGS: CT HEAD FINDINGS  Brain: No evidence of acute infarction, hemorrhage, hydrocephalus, extra-axial collection or mass lesion/mass effect. Prominence of CSF space overlying the left cerebellum question small arachnoid cyst. Vascular: No hyperdense vessel or unexpected calcification. Skull: Normal. Negative for fracture or focal lesion. Other: None. CT MAXILLOFACIAL FINDINGS Osseous: No fracture or mandibular dislocation. No destructive process. Orbits: Negative. No traumatic or inflammatory finding. Sinuses: Clear. Soft tissues: Negative. CT CERVICAL SPINE FINDINGS Alignment: Straightening of cervical lordosis Skull base and vertebrae: No acute fracture. No primary bone lesion or focal pathologic process. Soft tissues and spinal canal: No prevertebral fluid or swelling. No visible canal hematoma. Disc levels: No significant central foraminal stenosis. No jumped or perched facets. Upper chest: Negative. Other: None IMPRESSION: 1. No acute intracranial abnormality. 2. No acute maxillofacial fracture. 3. No acute posttraumatic cervical spine fracture or subluxation. Electronically Signed   By: Ashley Royalty M.D.   On: 08/11/2018 02:13   Ct Chest W Contrast  Result Date: 08/11/2018 CLINICAL DATA:  Abd trauma, blunt, stable. Clinical notes state: Patient was reaching overhead to pull a mattress box when it fell over on her striking right side of body. EXAM: CT CHEST, ABDOMEN, AND PELVIS WITH CONTRAST TECHNIQUE: Multidetector CT imaging of the chest, abdomen and pelvis was performed following the standard protocol during bolus  administration of intravenous contrast. CONTRAST:  172mL OMNIPAQUE IOHEXOL 300 MG/ML  SOLN COMPARISON:  Abdominal CT 03/15/2017 FINDINGS: CT CHEST FINDINGS Cardiovascular: No acute aortic injury. Left vertebral artery arises directly from the thoracic aorta, normal variant. Normal heart size. No pericardial effusion. Mediastinum/Nodes: No mediastinal hemorrhage or hematoma. No pneumomediastinum. The esophagus is decompressed. No adenopathy. Visualized thyroid gland is unremarkable. Lungs/Pleura: No pneumothorax. No focal airspace disease or pulmonary contusion. Trace dependent atelectasis in both lungs. Trachea and mainstem bronchi are patent. Musculoskeletal: No fracture of the ribs, thoracic spine, sternum, included clavicles or shoulder girdles. No confluent chest wall contusion. CT ABDOMEN PELVIS FINDINGS Hepatobiliary: No hepatic injury or perihepatic hematoma. Diffusely decreased hepatic density suggesting steatosis. An 8 mm enhancing focus in the hepatic dome as well as 11 mm enhancing focus in the left hepatic lobe is likely flash filling hemangiomas or AP shunts. Gallbladder is unremarkable. Pancreas: No evidence of injury. No ductal dilatation or inflammation. Spleen: No splenic injury or perisplenic hematoma. Adrenals/Urinary Tract: No adrenal hemorrhage or renal injury identified. Homogeneous renal enhancement with symmetric excretion on delayed phase imaging. Bladder is unremarkable. Stomach/Bowel: No evidence of bowel injury or mesenteric hematoma. No bowel wall thickening or inflammatory change. Post appendectomy. Stomach is decompressed. Vascular/Lymphatic: No acute vascular injury. The abdominal aorta and IVC are intact. No retroperitoneal fluid. No suspicious adenopathy. Reproductive: Multicystic cervical lesion appears similar to prior CT measuring 5.1 cm transverse. No suspicious adnexal mass. Other: No free fluid or free air. No confluent abdominal wall contusion. Musculoskeletal: No fracture of  the pelvis or lumbar spine. P scattered benign bone islands in the pelvis. IMPRESSION: 1. No evidence of acute traumatic injury to the chest, abdomen, or pelvis. 2. Unchanged appearance of cystic lesion in the cervix compared with prior CT and MRI. 3. Incidental mild hepatic steatosis. Small enhancing liver lesions are likely flash filling hemangiomas or AP shunts. Electronically Signed   By: Keith Rake M.D.   On: 08/11/2018 02:27   Ct Cervical Spine Wo Contrast  Result Date: 08/11/2018 CLINICAL DATA:  Head trauma EXAM: CT HEAD WITHOUT CONTRAST CT MAXILLOFACIAL WITHOUT CONTRAST CT CERVICAL SPINE WITHOUT CONTRAST TECHNIQUE: Multidetector CT imaging of the head, cervical spine,  and maxillofacial structures were performed using the standard protocol without intravenous contrast. Multiplanar CT image reconstructions of the cervical spine and maxillofacial structures were also generated. COMPARISON:  None. FINDINGS: CT HEAD FINDINGS Brain: No evidence of acute infarction, hemorrhage, hydrocephalus, extra-axial collection or mass lesion/mass effect. Prominence of CSF space overlying the left cerebellum question small arachnoid cyst. Vascular: No hyperdense vessel or unexpected calcification. Skull: Normal. Negative for fracture or focal lesion. Other: None. CT MAXILLOFACIAL FINDINGS Osseous: No fracture or mandibular dislocation. No destructive process. Orbits: Negative. No traumatic or inflammatory finding. Sinuses: Clear. Soft tissues: Negative. CT CERVICAL SPINE FINDINGS Alignment: Straightening of cervical lordosis Skull base and vertebrae: No acute fracture. No primary bone lesion or focal pathologic process. Soft tissues and spinal canal: No prevertebral fluid or swelling. No visible canal hematoma. Disc levels: No significant central foraminal stenosis. No jumped or perched facets. Upper chest: Negative. Other: None IMPRESSION: 1. No acute intracranial abnormality. 2. No acute maxillofacial fracture. 3.  No acute posttraumatic cervical spine fracture or subluxation. Electronically Signed   By: Ashley Royalty M.D.   On: 08/11/2018 02:13   Ct Abdomen Pelvis W Contrast  Result Date: 08/11/2018 CLINICAL DATA:  Abd trauma, blunt, stable. Clinical notes state: Patient was reaching overhead to pull a mattress box when it fell over on her striking right side of body. EXAM: CT CHEST, ABDOMEN, AND PELVIS WITH CONTRAST TECHNIQUE: Multidetector CT imaging of the chest, abdomen and pelvis was performed following the standard protocol during bolus administration of intravenous contrast. CONTRAST:  123mL OMNIPAQUE IOHEXOL 300 MG/ML  SOLN COMPARISON:  Abdominal CT 03/15/2017 FINDINGS: CT CHEST FINDINGS Cardiovascular: No acute aortic injury. Left vertebral artery arises directly from the thoracic aorta, normal variant. Normal heart size. No pericardial effusion. Mediastinum/Nodes: No mediastinal hemorrhage or hematoma. No pneumomediastinum. The esophagus is decompressed. No adenopathy. Visualized thyroid gland is unremarkable. Lungs/Pleura: No pneumothorax. No focal airspace disease or pulmonary contusion. Trace dependent atelectasis in both lungs. Trachea and mainstem bronchi are patent. Musculoskeletal: No fracture of the ribs, thoracic spine, sternum, included clavicles or shoulder girdles. No confluent chest wall contusion. CT ABDOMEN PELVIS FINDINGS Hepatobiliary: No hepatic injury or perihepatic hematoma. Diffusely decreased hepatic density suggesting steatosis. An 8 mm enhancing focus in the hepatic dome as well as 11 mm enhancing focus in the left hepatic lobe is likely flash filling hemangiomas or AP shunts. Gallbladder is unremarkable. Pancreas: No evidence of injury. No ductal dilatation or inflammation. Spleen: No splenic injury or perisplenic hematoma. Adrenals/Urinary Tract: No adrenal hemorrhage or renal injury identified. Homogeneous renal enhancement with symmetric excretion on delayed phase imaging. Bladder is  unremarkable. Stomach/Bowel: No evidence of bowel injury or mesenteric hematoma. No bowel wall thickening or inflammatory change. Post appendectomy. Stomach is decompressed. Vascular/Lymphatic: No acute vascular injury. The abdominal aorta and IVC are intact. No retroperitoneal fluid. No suspicious adenopathy. Reproductive: Multicystic cervical lesion appears similar to prior CT measuring 5.1 cm transverse. No suspicious adnexal mass. Other: No free fluid or free air. No confluent abdominal wall contusion. Musculoskeletal: No fracture of the pelvis or lumbar spine. P scattered benign bone islands in the pelvis. IMPRESSION: 1. No evidence of acute traumatic injury to the chest, abdomen, or pelvis. 2. Unchanged appearance of cystic lesion in the cervix compared with prior CT and MRI. 3. Incidental mild hepatic steatosis. Small enhancing liver lesions are likely flash filling hemangiomas or AP shunts. Electronically Signed   By: Keith Rake M.D.   On: 08/11/2018 02:27   Dg Humerus Right  Result Date: 08/11/2018 CLINICAL DATA:  Fall EXAM: RIGHT HUMERUS - 2+ VIEW COMPARISON:  None. FINDINGS: There is no evidence of fracture or other focal bone lesions. Soft tissues are unremarkable. IMPRESSION: Negative. Electronically Signed   By: Ulyses Jarred M.D.   On: 08/11/2018 01:20   Ct Maxillofacial Wo Contrast  Result Date: 08/11/2018 CLINICAL DATA:  Head trauma EXAM: CT HEAD WITHOUT CONTRAST CT MAXILLOFACIAL WITHOUT CONTRAST CT CERVICAL SPINE WITHOUT CONTRAST TECHNIQUE: Multidetector CT imaging of the head, cervical spine, and maxillofacial structures were performed using the standard protocol without intravenous contrast. Multiplanar CT image reconstructions of the cervical spine and maxillofacial structures were also generated. COMPARISON:  None. FINDINGS: CT HEAD FINDINGS Brain: No evidence of acute infarction, hemorrhage, hydrocephalus, extra-axial collection or mass lesion/mass effect. Prominence of CSF space  overlying the left cerebellum question small arachnoid cyst. Vascular: No hyperdense vessel or unexpected calcification. Skull: Normal. Negative for fracture or focal lesion. Other: None. CT MAXILLOFACIAL FINDINGS Osseous: No fracture or mandibular dislocation. No destructive process. Orbits: Negative. No traumatic or inflammatory finding. Sinuses: Clear. Soft tissues: Negative. CT CERVICAL SPINE FINDINGS Alignment: Straightening of cervical lordosis Skull base and vertebrae: No acute fracture. No primary bone lesion or focal pathologic process. Soft tissues and spinal canal: No prevertebral fluid or swelling. No visible canal hematoma. Disc levels: No significant central foraminal stenosis. No jumped or perched facets. Upper chest: Negative. Other: None IMPRESSION: 1. No acute intracranial abnormality. 2. No acute maxillofacial fracture. 3. No acute posttraumatic cervical spine fracture or subluxation. Electronically Signed   By: Ashley Royalty M.D.   On: 08/11/2018 02:13    Procedures Procedures (including critical care time)  Medications Ordered in ED Medications  fentaNYL (SUBLIMAZE) injection 50 mcg (has no administration in time range)     Initial Impression / Assessment and Plan / ED Course  I have reviewed the triage vital signs and the nursing notes.  Pertinent labs & imaging results that were available during my care of the patient were reviewed by me and considered in my medical decision making (see chart for details).    Patient with head injury after a box fell on her.  GCS 15.  ABCs intact.  Also has some abdominal pain to palpation and rib pain.  Rib x-ray at urgent care was negative.  No hypoxia.  Vitals are stable.  CT head and C-spine are negative.  Suspect concussion.  Abdominal CT scan is negative as well.  No evidence of intra-abdominal injury. Patient aware of cystic lesion on cervix and has been seeing gynecology.  Patient is tolerating p.o. and ambulatory.  Discussed head  injury precautions with patient.  Advised that headaches, dizziness and nausea may persist for several weeks.  Follow-up with PCP.  Return precautions discussed.  Final Clinical Impressions(s) / ED Diagnoses   Final diagnoses:  Minor head injury without loss of consciousness, initial encounter    ED Discharge Orders    None       Emmilyn Crooke, Annie Main, MD 08/11/18 [phone]

## 2018-08-11 NOTE — ED Notes (Signed)
Patient verbalizes understanding of discharge instructions. Opportunity for questioning and answers were provided. Armband removed by staff, pt discharged from ED. Ambulated out to lobby  

## 2018-08-11 NOTE — Discharge Instructions (Addendum)
Your CT scan is negative for any significant traumatic injury.  You likely have a concussion.  Headaches, dizziness, nausea may persist for several weeks.  Follow-up with your doctor.  You should also follow-up with your doctor regarding the cyst on your cervix which you are currently doing.  Return to the ED with new or worsening symptoms including worsening headaches, vomiting, confusion or any other concerns.

## 2019-10-01 IMAGING — MR MR PELVIS WO/W CM
12 of 19 series · 19 of 48 positions shown · IV contrast (yes)
Comparison: Ultrasound 04/07/2017 and CT on 03/15/2017

CLINICAL DATA: Cervical mass.

EXAM:
MRI PELVIS WITHOUT AND WITH CONTRAST
TECHNIQUE: Multiplanar multisequence MR imaging of the pelvis was performed
both before and after administration of intravenous contrast.
CONTRAST:  12mL MULTIHANCE GADOBENATE DIMEGLUMINE 529 MG/ML IV SOLN

[Series 4: T2 · coronal · 6.0mm · 0.78mm/px · 1 of 27 slices shown (1 of 4)]
[im 1/27]
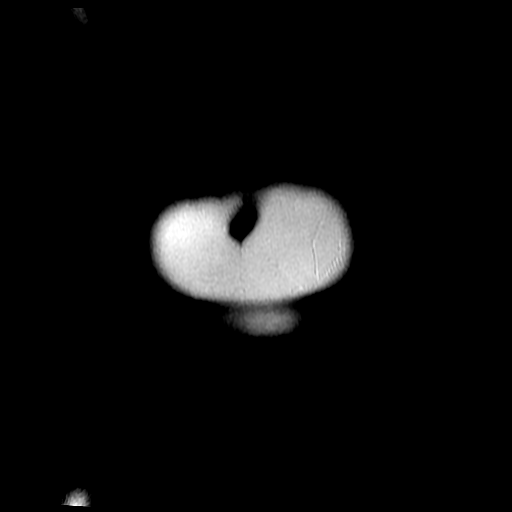

[Series 5: T2 · coronal · 5.0mm · 0.47mm/px · 1 of 30 slices shown (2 of 4)]
[im 1/30]
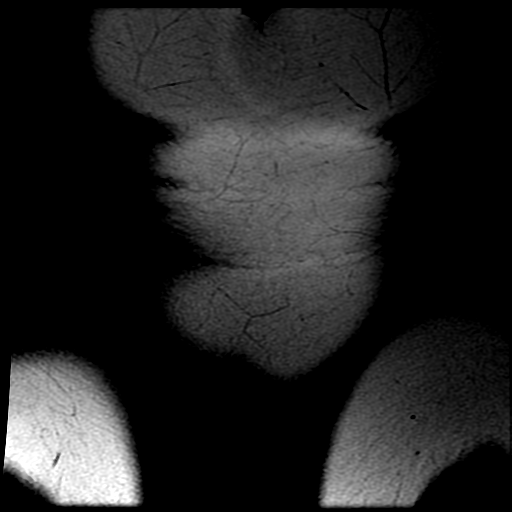

[Series 6: T2 · axial · 5.0mm · 0.47mm/px · 1 of 35 slices shown (3 of 4)]
[im 1/35]
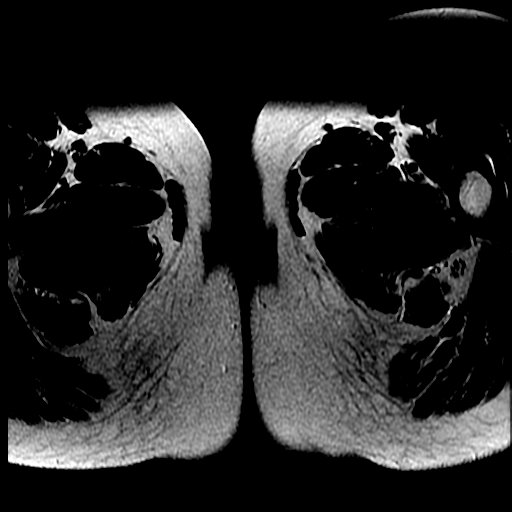

[Series 7: T2 fat-sat · axial · 5.0mm · 0.47mm/px · 1 of 35 slices shown]
[im 1/35]
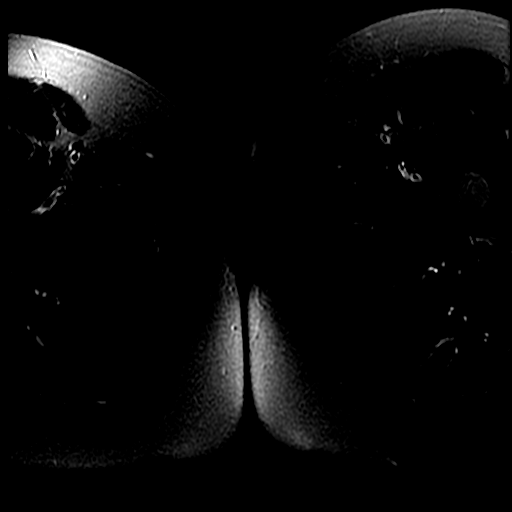

[Series 8: T2 · sagittal · 5.0mm · 0.47mm/px · 1 of 30 slices shown (4 of 4)]
[im 1/30]
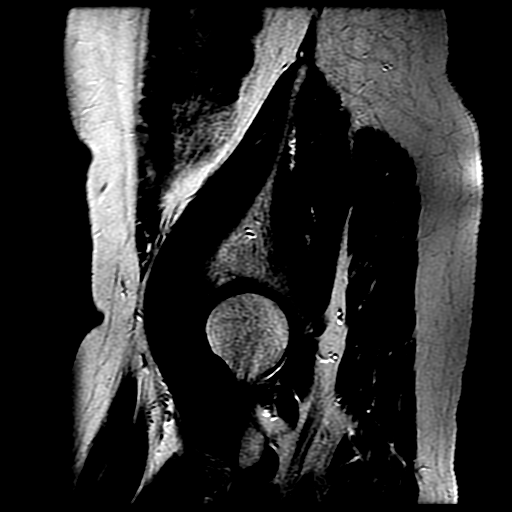

[Series 9: T1 · axial · 5.0mm · 0.47mm/px · 1 of 35 slices shown]
[im 1/35]
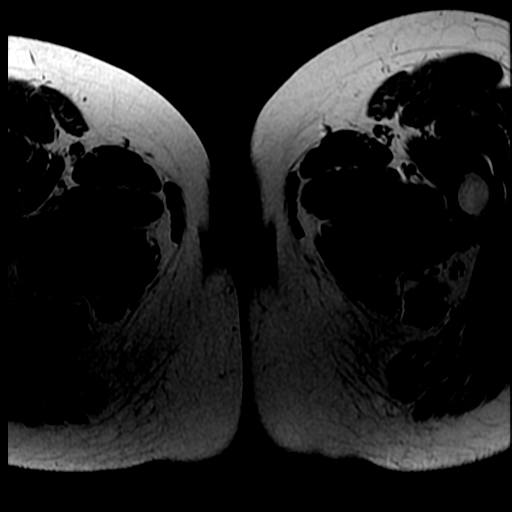

[Series 10: T1 fat-sat · axial · 5.0mm · 0.47mm/px · z∈[-205,-1]mm · 2 of 35 slices shown]
[im 1/35]
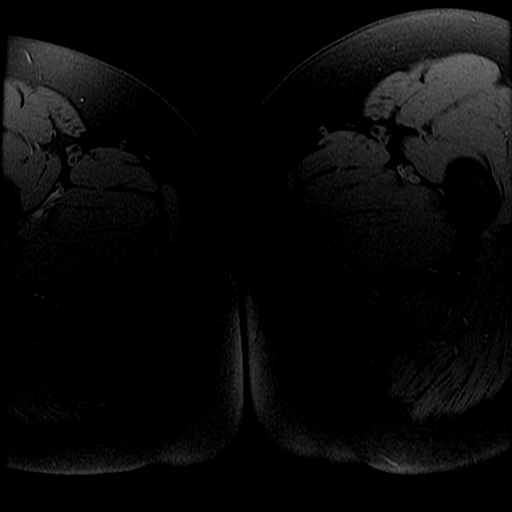
[im 35/35]
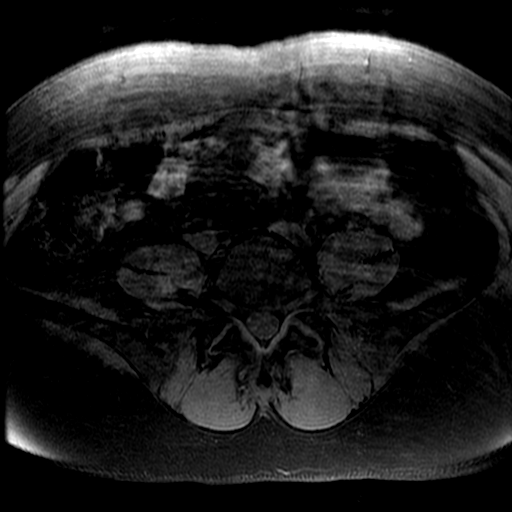

[Series 12: T2 post-contrast · sagittal · 5.0mm · 0.47mm/px · 1 of 30 slices shown]
[im 1/30]
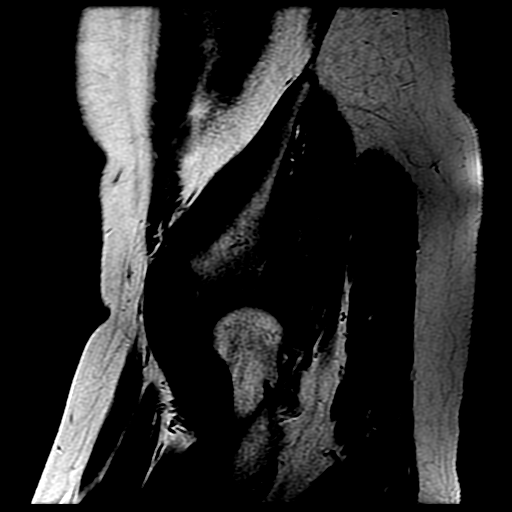

[Series 13: T1 fat-sat post-contrast · axial · 5.0mm · 0.47mm/px · z∈[-205,-1]mm · 2 of 35 slices shown (1 of 2)]
[im 1/35]
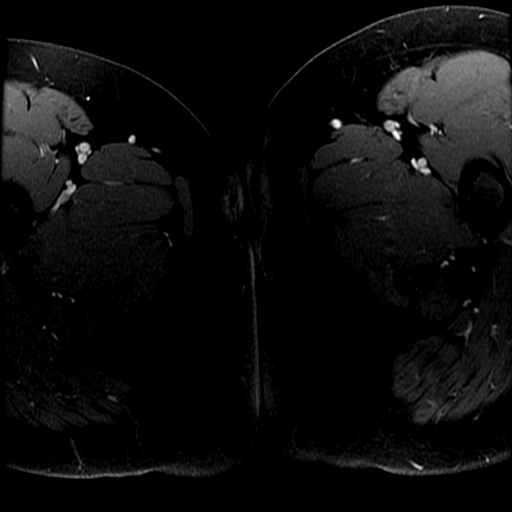
[im 35/35]
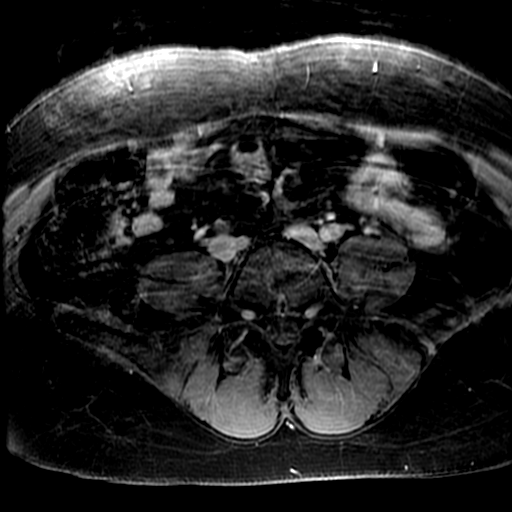

[Series 14: T1 fat-sat post-contrast · coronal · 5.0mm · 0.47mm/px · 1 of 30 slices shown (2 of 2)]
[im 1/30]
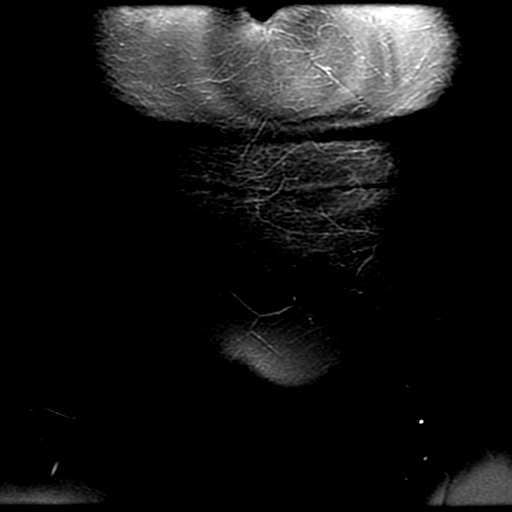

[((id)/(id)/1)-((id)/(id)/1) · sagittal · 3.0mm · 0.51mm/px · 4 of 84 slices shown (1 of 2)]
[im 1/84]
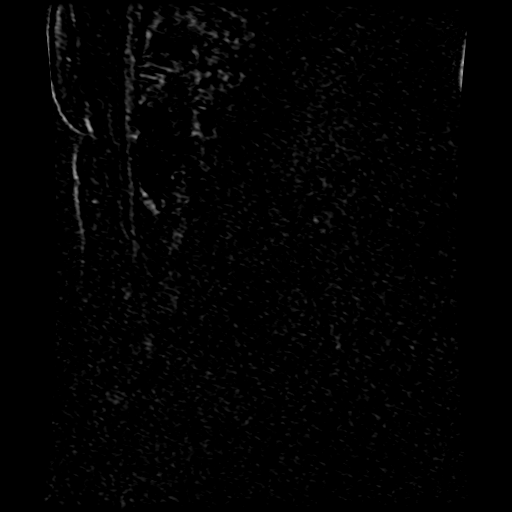
[im 28/84]
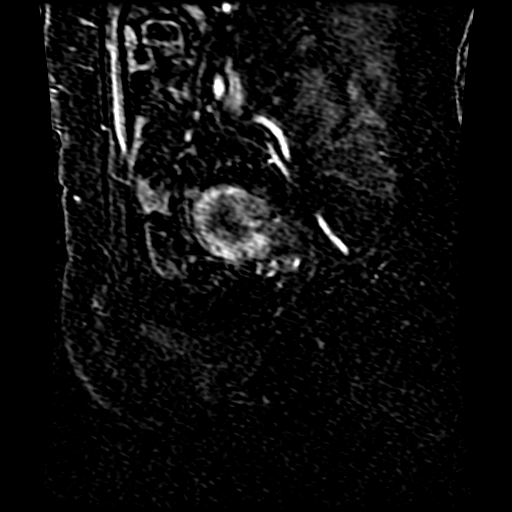
[im 56/84]
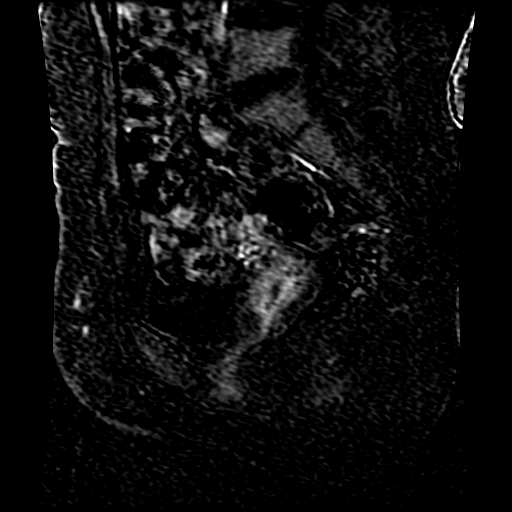
[im 84/84]
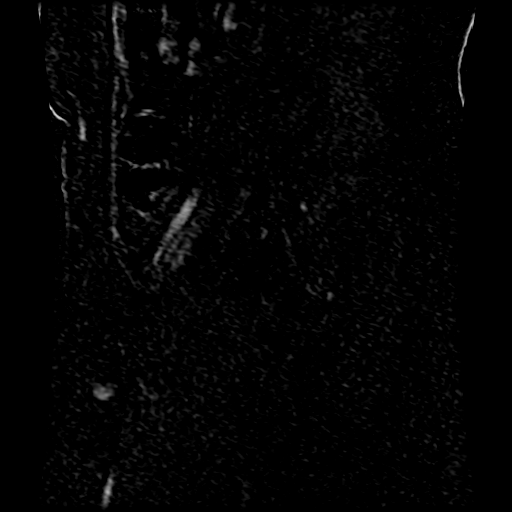

[((id)/(id)/1)-((id)/(id)/1) · sagittal · 3.0mm · 0.51mm/px · 3 of 84 slices shown (2 of 2)]
[im 1/84]
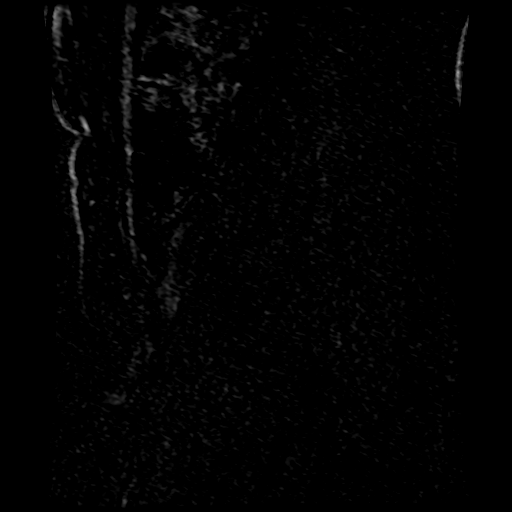
[im 28/84]
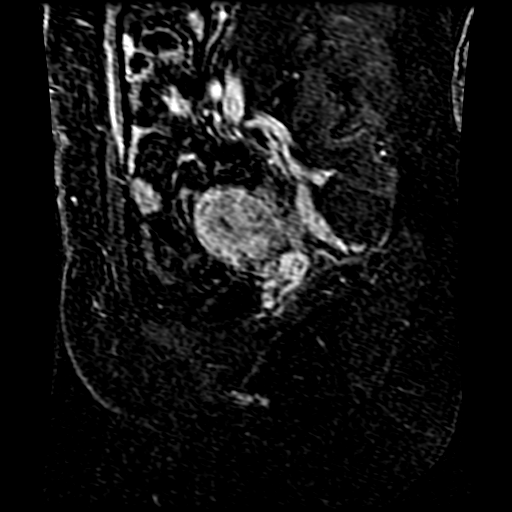
[im 56/84]
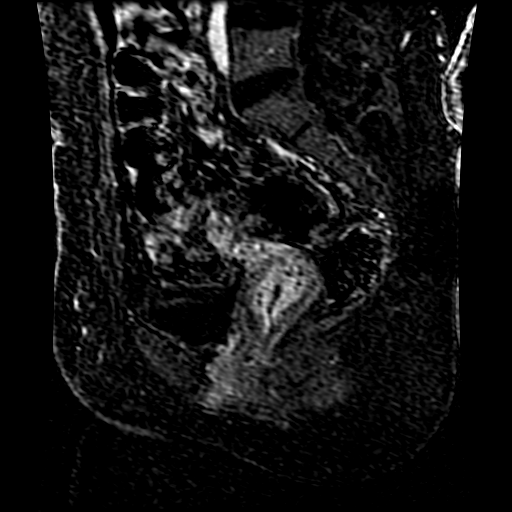

[19 of 48 positions shown; findings below may reference images not displayed]

FINDINGS: Urinary Tract: No urinary bladder or urethral abnormality.

Bowel: Unremarkable pelvic bowel loops.

Vascular/Lymphatic: Unremarkable. No pathologically enlarged pelvic
lymph nodes identified.

Reproductive:

-- Uterus: Measures 7.5 x 4.2 by 5.1 cm. No fibroids identified.
Endometrial thickness measures 6 cm. A multilocular cystic lesion is
seen within the cervix which measures 3.1 x 2.5 by 3.0 cm. This
shows numerous enhancing internal septations but no definite solid
nodular component. No evidence of extra uterine extension into the
parametrium.

-- Right ovary: Appears normal. No mass or inflammatory process
identified.

-- Left ovary: Appears normal. No mass or inflammatory process
identified.

Other: No peritoneal masses or abnormal free fluid.

Musculoskeletal:  Unremarkable.
IMPRESSION: 3 cm multilocular cystic lesion in the cervix. Differential
diagnosis includes uterine cervicitis, endocervical hyperplasia,
tunnel cluster, and adenoma malignum.

No evidence of pelvic metastatic disease or other significant
abnormality.

## 2021-03-16 ENCOUNTER — Ambulatory Visit: Payer: Self-pay | Admitting: Internal Medicine

## 2021-05-24 ENCOUNTER — Ambulatory Visit: Payer: Self-pay | Admitting: Internal Medicine

## 2021-05-24 ENCOUNTER — Other Ambulatory Visit: Payer: Self-pay

## 2021-05-24 ENCOUNTER — Encounter: Payer: Self-pay | Admitting: Internal Medicine

## 2021-05-24 VITALS — BP 118/76 | HR 72 | Resp 12 | Ht 61.25 in | Wt 157.0 lb

## 2021-05-24 DIAGNOSIS — R102 Pelvic and perineal pain: Secondary | ICD-10-CM | POA: Insufficient documentation

## 2021-05-24 DIAGNOSIS — Z23 Encounter for immunization: Secondary | ICD-10-CM

## 2021-05-24 DIAGNOSIS — E663 Overweight: Secondary | ICD-10-CM

## 2021-05-24 DIAGNOSIS — N888 Other specified noninflammatory disorders of cervix uteri: Secondary | ICD-10-CM

## 2021-05-24 DIAGNOSIS — Z833 Family history of diabetes mellitus: Secondary | ICD-10-CM

## 2021-05-24 LAB — POCT URINALYSIS DIPSTICK
Bilirubin, UA: NEGATIVE
Blood, UA: NEGATIVE
Glucose, UA: NEGATIVE
Ketones, UA: NEGATIVE
Leukocytes, UA: NEGATIVE
Nitrite, UA: NEGATIVE
Protein, UA: NEGATIVE
Spec Grav, UA: 1.025 (ref 1.010–1.025)
Urobilinogen, UA: 0.2 E.U./dL
pH, UA: 5 (ref 5.0–8.0)

## 2021-05-24 LAB — POCT WET PREP WITH KOH
Clue Cells Wet Prep HPF POC: NEGATIVE
KOH Prep POC: NEGATIVE
RBC Wet Prep HPF POC: NEGATIVE
Trichomonas, UA: NEGATIVE
Yeast Wet Prep HPF POC: NEGATIVE

## 2021-05-24 MED ORDER — IBUPROFEN 200 MG PO TABS: ORAL_TABLET | ORAL | 0 refills | Status: AC

## 2021-05-24 NOTE — Addendum Note (Signed)
Addended by: Mariah Milling on: 05/24/2021 12:22 PM   Modules accepted: Orders

## 2021-05-24 NOTE — Progress Notes (Signed)
Subjective:    Patient ID: Caitlin Bailey, female   DOB: Jun 09, 1981, 40 y.o.   MRN: [id number]   HPI  Here to establish  Interpreted by Tildon Husky   Pain in bilateral lower abdomen for 3 months:  Feels like menstrual cramps, but all the time.   Not only when has menstrual flow, though pain is worse during menstrual flow.   Also feels bloated.  Worse when sitting down or when wearing tight jeans.          3 out of 7 days of the week with the discomfort.   No melena or hematochezia.  No diarrhea or constipation.   No dysuria or urinary frequency.   Does note some short term improvement in the discomfort when passes BM.   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   Pap smears have been normal, the last in her chart from 03/2017. History also of left ovarian cyst when living in [place] many years ago, reportedly benign. Also noted hemorrhagic right ovarian cyst by surgical biopsy in 02/2017 when underwent appendectomy. Periods are regular and last 7 days.  First 3 days, she has heavy flow and at times clotting.   No history of PID or STI.   She has been having vaginal irritation after intercourse recently--using condoms regularly.  Tried OCPs in past, but caused N & V.  7 years ago.   G2P2 Both NVD.  Did have fetal macrosomia with first pregnancy--almost 10 lbs and she is small.  Was considered high risk because of fetal size. August 2016 was last NVD.     No outpatient medications have been marked as taking for the 05/24/21 encounter (Office Visit) with Julieanne Manson, MD.   No Known Allergies  Past Medical History:  Diagnosis Date   Cervical mass    Depression    History of ovarian cyst 03/15/2017   post right ovarian cystectomy-- per path  benign   Migraine      Past Surgical History:  Procedure Laterality Date   CYST EXCISION Left 2000s   "groin/inner thigh"   LAPAROSCOPIC APPENDECTOMY N/A 03/15/2017   Procedure: APPENDECTOMY LAPAROSCOPIC;  Surgeon: Jimmye Norman, MD;  Location: [facility] OR;  Service: General;  Laterality: N/A;  and RIGHT OVARIAN CYSTECTOMY   OVARIAN CYST REMOVAL  2009   ex-lap    Family History  Problem Relation Age of Onset   Heart disease Mother    Kidney disease Father        ultimately developed multi organ failure   Diabetes Father    Diabetes Brother    Cancer Paternal Aunt    Social History   Socioeconomic History   Marital status: Domestic Partner    Spouse name: Marquita Palms   Number of children: 2   Years of education: 12   Highest education level: Not on file  Occupational History   Occupation: Homemaker  Tobacco Use   Smoking status: Never   Smokeless tobacco: Never  Vaping Use   Vaping Use: Never used  Substance and Sexual Activity   Alcohol use: No   Drug use: No   Sexual activity: Yes    Birth control/protection: Condom  Other Topics Concern   Not on file  Social History Narrative  Lives at home with female partner of 12 years and their 2 children.   Originally from [place]   Social Determinants of Corporate investment banker Strain: Low Risk    Difficulty of Paying Living Expenses: Not hard at all  Food Insecurity: No Food Insecurity   Worried About Programme researcher, broadcasting/film/video in the Last Year: Never true   Barista in the Last Year: Never true  Transportation Needs: No Transportation Needs   Lack of Transportation (Medical): No   Lack of Transportation (Non-Medical): No  Physical Activity: Not on file  Stress: Not on file  Social Connections: Not on file  Intimate Partner Violence: Not At Risk   Fear of Current or Ex-Partner: No   Emotionally Abused: No   Physically Abused: No   Sexually Abused: No     Review of Systems    Objective:   BP 118/76 (BP  Location: Right Arm, Patient Position: Sitting, Cuff Size: Normal)   Pulse 72   Resp 12   Ht 5' 1.25" (1.556 m)   Wt 157 lb (71.2 kg)   LMP 04/29/2021 (Exact Date)   BMI 29.42 kg/m   Physical Exam NAD HEENT:  PERRL, EOMI Neck:  Supple, No adenopathy, no thyromegaly Chest:  CTA CV:  RRR with normal S1 andS2, No S3, S4 or murmur.  Radial and DP pulses normal and equal. Abd:  S, + BS.  Tender mainly in suprapubic area with more minor tenderness in bilateral lower quads.  No rebound or peritoneal signs.  No HSM or mass.   LE:  No edema GU:  Normal external female genitalia.  No vaginal discharge.  Cervix deformed with cervix at 12 O'Clock with a pointed almost hood like extension.  Endocervix opening enlarge and also with excess internal tissue noted.  I do not see a definitive cervical polyp, however.   No CMT. Most signficant tenderness is with bimanual palpation of uterus, which is small, firm and somewhat anteverted.  Some tenderness also when palpating right adnexa, though no mass noted.  Minimal tenderness with palpation of left adnexal area.   Assessment & Plan    Chronic pelvic pain:  seems to be originating from the uterus most significantly.  Not clear how much of cervical changes are due to the benign cervical cyst.  She has elected to apply for orange card to obtain pelvic ultrasound at low cost.  CBC, CMP, HIV, RPR, swab for GC/Chlamydia.  Wet prep unremarkable.    2.  Family history of DM and patient overweight:  A1C, LP.    3.  HM:  Declined covid vaccination.  Influenza vaccination.

## 2021-05-25 LAB — HIV ANTIBODY (ROUTINE TESTING W REFLEX): HIV Screen 4th Generation wRfx: NONREACTIVE

## 2021-05-25 LAB — COMPREHENSIVE METABOLIC PANEL
ALT: 40 IU/L — ABNORMAL HIGH (ref 0–32)
AST: 22 IU/L (ref 0–40)
Albumin/Globulin Ratio: 1.8 (ref 1.2–2.2)
Albumin: 4.5 g/dL (ref 3.8–4.8)
Alkaline Phosphatase: 83 IU/L (ref 44–121)
BUN/Creatinine Ratio: 26 — ABNORMAL HIGH (ref 9–23)
BUN: 15 mg/dL (ref 6–20)
Bilirubin Total: 0.5 mg/dL (ref 0.0–1.2)
CO2: 21 mmol/L (ref 20–29)
Calcium: 8.9 mg/dL (ref 8.7–10.2)
Chloride: 104 mmol/L (ref 96–106)
Creatinine, Ser: 0.58 mg/dL (ref 0.57–1.00)
Globulin, Total: 2.5 g/dL (ref 1.5–4.5)
Glucose: 92 mg/dL (ref 70–99)
Potassium: 4 mmol/L (ref 3.5–5.2)
Sodium: 137 mmol/L (ref 134–144)
Total Protein: 7 g/dL (ref 6.0–8.5)
eGFR: 119 mL/min/{1.73_m2} (ref >59)

## 2021-05-25 LAB — CBC WITH DIFFERENTIAL/PLATELET
Basophils Absolute: 0 10*3/uL (ref 0.0–0.2)
Basos: 1 %
EOS (ABSOLUTE): 0.2 10*3/uL (ref 0.0–0.4)
Eos: 3 %
Hematocrit: 41.2 % (ref 34.0–46.6)
Hemoglobin: 13.4 g/dL (ref 11.1–15.9)
Immature Grans (Abs): 0 10*3/uL (ref 0.0–0.1)
Immature Granulocytes: 0 %
Lymphocytes Absolute: 2.2 10*3/uL (ref 0.7–3.1)
Lymphs: 37 %
MCH: 29.9 pg (ref 26.6–33.0)
MCHC: 32.5 g/dL (ref 31.5–35.7)
MCV: 92 fL (ref 79–97)
Monocytes Absolute: 0.3 10*3/uL (ref 0.1–0.9)
Monocytes: 5 %
Neutrophils Absolute: 3.3 10*3/uL (ref 1.4–7.0)
Neutrophils: 54 %
Platelets: 285 10*3/uL (ref 150–450)
RBC: 4.48 x10E6/uL (ref 3.77–5.28)
RDW: 13.5 % (ref 11.7–15.4)
WBC: 6 10*3/uL (ref 3.4–10.8)

## 2021-05-25 LAB — LIPID PANEL W/O CHOL/HDL RATIO
Cholesterol, Total: 225 mg/dL — ABNORMAL HIGH (ref 100–199)
HDL: 39 mg/dL — ABNORMAL LOW (ref >39)
LDL Chol Calc (NIH): 159 mg/dL — ABNORMAL HIGH (ref 0–99)
Triglycerides: 149 mg/dL (ref 0–149)
VLDL Cholesterol Cal: 27 mg/dL (ref 5–40)

## 2021-05-25 LAB — RPR QUALITATIVE: RPR Ser Ql: NONREACTIVE

## 2021-05-25 LAB — HGB A1C W/O EAG: Hgb A1c MFr Bld: 5.6 % (ref 4.8–5.6)

## 2021-05-26 LAB — GC/CHLAMYDIA PROBE AMP
Chlamydia trachomatis, NAA: NEGATIVE
Neisseria Gonorrhoeae by PCR: NEGATIVE

## 2021-08-23 ENCOUNTER — Other Ambulatory Visit: Payer: Self-pay

## 2021-08-23 ENCOUNTER — Ambulatory Visit: Payer: Self-pay | Admitting: Internal Medicine

## 2021-08-23 ENCOUNTER — Encounter: Payer: Self-pay | Admitting: Internal Medicine

## 2021-08-23 VITALS — BP 110/72 | HR 80 | Resp 16 | Ht 61.25 in | Wt 155.0 lb

## 2021-08-23 DIAGNOSIS — E663 Overweight: Secondary | ICD-10-CM

## 2021-08-23 DIAGNOSIS — R102 Pelvic and perineal pain unspecified side: Secondary | ICD-10-CM

## 2021-08-23 DIAGNOSIS — E7800 Pure hypercholesterolemia, unspecified: Secondary | ICD-10-CM

## 2021-08-23 DIAGNOSIS — R7401 Elevation of levels of liver transaminase levels: Secondary | ICD-10-CM

## 2021-08-23 HISTORY — DX: Elevation of levels of liver transaminase levels: R74.01

## 2021-08-23 NOTE — Patient Instructions (Signed)

## 2021-08-23 NOTE — Progress Notes (Unsigned)
Subjective:    Patient ID: Caitlin Bailey, female   DOB: 08/20/1980, 41 y.o.   MRN: [id number]   HPI  Interpreted by Tildon Husky   Elevated ALT:  Discussed her ALT was slightly elevated at 40 back in November.  Checking it again today.  Was to work on lifestyle changes to get cholesterol down as ALT elevation likely due to fatty liver.    2.  Hypercholesterolemia with low HDL/overweight:  Discussed how to look at nutrition labels for foods high in good fats and low in carbs and with lots of fiber.  Also discussed serving size and eating habits.   Describes needing to eat slowly as if she east rapidly, she develops abdominal pain.    3.  Pelvic pain:  Has had difficulties with applying for orange card.  Husband lost his job and just stopped going out and getting things done. Still having the pelvic pain.  Severe with her periods. Takes ibuprofen for the pelvic pain--1 tab every 6 hours.  4.  Reported history of migraines:  states since 41 years old, for which she is also taking Ibuprofen.  States was diagnosed in [place].  Was treated for 3 years in [place].  Brings this up as she is concerned with taking ibuprofen regularly.  Current Meds  Medication Sig   ibuprofen (ADVIL) 200 MG tablet 2-4 pastillas cada 6 horas con comida a necesita para dolor   No Known Allergies   Review of Systems    Objective:   BP 110/72 (BP Location: Right Arm, Patient Position: Sitting, Cuff Size: Normal)    Pulse 80    Resp 16    Ht 5' 1.25" (1.556 m)    Wt 155 lb (70.3 kg)    LMP 07/27/2021 (Within Days)    BMI 29.05 kg/m   Physical Exam   Assessment & Plan    Elevated ALT:  recheck today.  If still elevated, add viral hepatitis testing.

## 2021-08-24 LAB — HEPATIC FUNCTION PANEL
ALT: 46 IU/L — ABNORMAL HIGH (ref 0–32)
AST: 24 IU/L (ref 0–40)
Albumin: 4.4 g/dL (ref 3.8–4.8)
Alkaline Phosphatase: 91 IU/L (ref 44–121)
Bilirubin Total: 0.5 mg/dL (ref 0.0–1.2)
Bilirubin, Direct: 0.14 mg/dL (ref 0.00–0.40)
Total Protein: 7.3 g/dL (ref 6.0–8.5)

## 2021-11-22 ENCOUNTER — Encounter: Payer: Self-pay | Admitting: Internal Medicine

## 2021-11-22 ENCOUNTER — Ambulatory Visit: Payer: Self-pay | Admitting: Internal Medicine

## 2021-11-22 ENCOUNTER — Other Ambulatory Visit: Payer: Self-pay

## 2021-11-22 VITALS — BP 108/74 | HR 72 | Resp 12 | Ht 61.25 in | Wt 153.0 lb

## 2021-11-22 DIAGNOSIS — R7401 Elevation of levels of liver transaminase levels: Secondary | ICD-10-CM

## 2021-11-22 DIAGNOSIS — R102 Pelvic and perineal pain: Secondary | ICD-10-CM

## 2021-11-22 DIAGNOSIS — N920 Excessive and frequent menstruation with regular cycle: Secondary | ICD-10-CM

## 2021-11-22 DIAGNOSIS — G43009 Migraine without aura, not intractable, without status migrainosus: Secondary | ICD-10-CM

## 2021-11-22 MED ORDER — TOPIRAMATE 50 MG PO TABS: ORAL_TABLET | ORAL | 11 refills | Status: DC

## 2021-11-22 MED ORDER — SUMATRIPTAN SUCCINATE 50 MG PO TABS: ORAL_TABLET | ORAL | 11 refills | Status: DC

## 2021-11-22 NOTE — Progress Notes (Signed)
? ? ? ?Subjective:  ?  ?Patient ID: Caitlin Bailey, female   DOB: 1981/02/24, 41 y.o.   MRN: [id number] ? ? ?HPI ? ?Tildon Husky interprets ? ? Dysmenorrhea:  Had pelvic ultrasound back in April, but not populating chart as name apparently different at other Epic facility.  Still having significant cramping with periods.  Is having heavy flow at times with large blood clots as well.  She was not anemic with last CBC 05/24/2021.  Has a history of benign cervical mass, biopsied with benign findings in 2019 by Dr. Duard Brady. ? ?2.  Elevated ALT:  in 40s, but still elevated.  To have viral hepatitis serology today. ? ?3.  Migraines:  Last migraine started 5 days ago and did not go away until yesterday.  Her head still feels fragile and heavy. ?Migraines first started when she was bout 41 years old.   ?No aura ?Sometimes, would develop sensitivity to any pressure at temples--including weight of hair there.   ?Pain can be on either side, generally in temple area, but always starts and stays unilateral. ?Describes pain as pressure out, like a balloon that will burst.   ?+ throbbing pain as well. ?+ associated nausea and vomiting. ?+ Photophobia ?+ Phonophobia.   ?Has been evaluated for this in past, particularly in [place].  She is not aware of any scans being performed.   ?No one else in family with migraines.   ? ?Can have a migraine once monthly to daily for months at a time.  Very difficult to get an estimate of frequency.  States last summer, every day--stated yes to daily for 3 months. ?When first diagnosed, was taking a medication daily to prevent headaches, which she states was very helpful.  Took for about 8 months.  Father died at that point and could not find time or money to continue being seen and purchasing medication.  Cannot remember the name of the medication.   ?She has never been on an oral medication to interrupt a migraine in past.   ? ?She does not recall taking Topamax and meclizine when  prescribed for migraines at [facility] in 2018.   ? ? ? ?Current Meds  ?Medication Sig  ? ibuprofen (ADVIL) 200 MG tablet 2-4 pastillas cada 6 horas con comida a necesita para dolor  ? ?No Known Allergies ? ? ?Review of Systems ? ? ? ?Objective:  ? ?BP 108/74 (BP Location: Right Arm, Patient Position: Sitting, Cuff Size: Normal)   Pulse 72   Resp 12   Ht 5' 1.25" (1.556 m)   Wt 153 lb (69.4 kg)   LMP 10/23/2021 (Exact Date)   BMI 28.67 kg/m?  ? ?Physical Exam ?NAD ?HEENT:  PERRL, EOMI, Discs sharp. TMs pearly gray, throat without injection.   ?Neck:  Supple, No adenopathy, no thyromegaly ?Chest:  CTA ?CV:  RRR without murmur or rub  Radial and DP pulses normal and equal ?Abd:  S, NT, No HSM or mass, + BS ?LE:  No edema ?Neuro:  A & O x 3, CN II-XII grossly intact.  DTRs 2+/4 throughout, Motor 5/5 throughout, sensory grossly normal.   ? ? ?Assessment & Plan  ? ? Migraine without aura:  By history, very frequent.  Start preventive with Topamax 50 mg at bedtime daily.  Imitrex 50 mg to repeat in 2 hours if not relieved  with max for now of 100 mg/24 hours.  Follow up in 3 months. ? ?2.  Elevated  ALT:  hepatic profile, Viral hepatitis serology. ? ?3.  Dysmenorrhea and menorrhagia:  hunting down pelvic ultrasound from [facility].  Will need to correct names utilized with both Epic programs. ? ?

## 2021-11-23 LAB — CBC WITH DIFFERENTIAL/PLATELET
Basophils Absolute: 0 10*3/uL (ref 0.0–0.2)
Basos: 1 %
EOS (ABSOLUTE): 0.2 10*3/uL (ref 0.0–0.4)
Eos: 3 %
Hematocrit: 41.9 % (ref 34.0–46.6)
Hemoglobin: 13.5 g/dL (ref 11.1–15.9)
Immature Grans (Abs): 0 10*3/uL (ref 0.0–0.1)
Immature Granulocytes: 0 %
Lymphocytes Absolute: 2.5 10*3/uL (ref 0.7–3.1)
Lymphs: 42 %
MCH: 29.8 pg (ref 26.6–33.0)
MCHC: 32.2 g/dL (ref 31.5–35.7)
MCV: 93 fL (ref 79–97)
Monocytes Absolute: 0.3 10*3/uL (ref 0.1–0.9)
Monocytes: 4 %
Neutrophils Absolute: 3 10*3/uL (ref 1.4–7.0)
Neutrophils: 50 %
Platelets: 305 10*3/uL (ref 150–450)
RBC: 4.53 x10E6/uL (ref 3.77–5.28)
RDW: 13.6 % (ref 11.7–15.4)
WBC: 6 10*3/uL (ref 3.4–10.8)

## 2021-11-23 LAB — HEPATIC FUNCTION PANEL
ALT: 28 IU/L (ref 0–32)
AST: 19 IU/L (ref 0–40)
Albumin: 4.3 g/dL (ref 3.8–4.8)
Alkaline Phosphatase: 88 IU/L (ref 44–121)
Bilirubin Total: 0.5 mg/dL (ref 0.0–1.2)
Bilirubin, Direct: <0.1 mg/dL (ref 0.00–0.40)
Total Protein: 7.4 g/dL (ref 6.0–8.5)

## 2021-11-23 LAB — HEPATITIS B SURFACE ANTIBODY,QUALITATIVE: Hep B Surface Ab, Qual: NONREACTIVE

## 2021-11-23 LAB — HEPATITIS C ANTIBODY: Hep C Virus Ab: NONREACTIVE

## 2021-11-23 LAB — HEPATITIS A ANTIBODY, TOTAL: hep A Total Ab: POSITIVE — AB

## 2021-11-23 LAB — HEPATITIS B CORE ANTIBODY, TOTAL: Hep B Core Total Ab: NEGATIVE

## 2021-11-23 LAB — HEPATITIS B SURFACE ANTIGEN: Hepatitis B Surface Ag: NEGATIVE

## 2022-03-01 ENCOUNTER — Ambulatory Visit: Payer: Self-pay | Admitting: Internal Medicine

## 2022-03-01 ENCOUNTER — Encounter: Payer: Self-pay | Admitting: Internal Medicine

## 2022-03-01 VITALS — BP 120/70 | HR 76 | Resp 16 | Ht 61.25 in | Wt 155.0 lb

## 2022-03-01 DIAGNOSIS — T887XXA Unspecified adverse effect of drug or medicament, initial encounter: Secondary | ICD-10-CM

## 2022-03-01 DIAGNOSIS — G43009 Migraine without aura, not intractable, without status migrainosus: Secondary | ICD-10-CM

## 2022-03-01 DIAGNOSIS — Z789 Other specified health status: Secondary | ICD-10-CM

## 2022-03-01 DIAGNOSIS — R7401 Elevation of levels of liver transaminase levels: Secondary | ICD-10-CM

## 2022-03-01 MED ORDER — NORTRIPTYLINE HCL 10 MG PO CAPS: 10.0000 mg | ORAL_CAPSULE | Freq: Every day | ORAL | 6 refills | Status: DC

## 2022-03-01 MED ORDER — SUMATRIPTAN SUCCINATE 100 MG PO TABS: ORAL_TABLET | ORAL | 6 refills | Status: DC

## 2022-03-01 NOTE — Progress Notes (Signed)
Subjective:    Patient ID: Caitlin Bailey, female   DOB: 05-06-1981, 41 y.o.   MRN: [id number]   HPI  Tildon Husky interprets   Migraines:  States took Topamax for 30 days, but felt pressure on her chest when taking and problems breathing.  States the symptoms would start about 30 minutes after taking and last throughout the day.  Stopped immediately once she stopped taking Topamax.   Continues to use the sumatriptan for when she "has a stronger headache"  She has needed to take 100 mg in 2 separate doses each time.  50 mg does not resolve the headache completely.     Uses Ibuprofen if feels will be a minor headache and moves to Sumatriptan if worsens.   States her headaches are more tolerable.   Does have a headache every single day.  Sometimes can go every 2-3 days.  Has utilized the Sumatriptan on 6 occasions.     Current Meds  Medication Sig   ibuprofen (ADVIL) 200 MG tablet 2-4 pastillas cada 6 horas con comida a necesita para dolor   SUMAtriptan (IMITREX) 50 MG tablet 1 tab by mouth for migraine headache may repeat in 2 hours if headache persists.  Limit 100 mg in 24 hours.   No Known Allergies   Review of Systems    Objective:   BP 120/70 (BP Location: Right Arm, Patient Position: Sitting, Cuff Size: Normal)   Pulse 76   Resp 16   Ht 5' 1.25" (1.556 m)   Wt 155 lb (70.3 kg)   LMP 02/16/2022 (Exact Date)   BMI 29.05 kg/m   Physical Exam NAD HEENT:  PERRL, EOMI Neck:  Supple, No adenopathy Chest:  CTA CV:  RRR without murmur or rub.  Radial and DP pulses normal and equal. Neuro:  A & O x 3, CN II-XII grossly intact.  Motor 5/5 and DTRs 2+/4 throughout.   Gait normal.     Assessment & Plan   Migraines/headaches:  Increase Sumatriptan to 100 mg for initial dose.  Discussed should not wait more than 2 hours to repeat if still with migraine headache.  Did not tolerated Topamax for headache prevention.  As she also does not have good sleep, will try  low dose nortriptyline 10 mg at bedtime.  Follow up in 3-4 months for follow up with CPE.  2.  History of pelvic pain:  still unable to find the pelvic US, which was normal, in care everywhere.  They apparently did not change her name appropriately.  Do not see the copy faxed with incorrect name scanned into chart--Eli will check into whether the paperwork was sent for scanning or not and call [facility] to see if name correction not made.    3.  History of elevated ALT:  resolved with recheck 3 months ago.  Hepatitis A was positive, B negative, including for immunity, C also negative.  Forgot to go over this with her today.  Will recommend Hepatitis B vaccination when in next.

## 2022-06-20 ENCOUNTER — Encounter: Payer: Self-pay | Admitting: Internal Medicine

## 2022-06-23 ENCOUNTER — Encounter: Payer: Self-pay | Admitting: Internal Medicine

## 2022-08-29 ENCOUNTER — Ambulatory Visit (HOSPITAL_COMMUNITY)
Admission: EM | Admit: 2022-08-29 | Discharge: 2022-08-29 | Disposition: A | Discharge status: Home / Self Care | Payer: Self-pay | Attending: Internal Medicine | Admitting: Internal Medicine

## 2022-08-29 ENCOUNTER — Other Ambulatory Visit: Payer: Self-pay

## 2022-08-29 ENCOUNTER — Encounter (HOSPITAL_COMMUNITY): Payer: Self-pay | Admitting: *Deleted

## 2022-08-29 DIAGNOSIS — G43111 Migraine with aura, intractable, with status migrainosus: Secondary | ICD-10-CM

## 2022-08-29 MED ORDER — DEXAMETHASONE SODIUM PHOSPHATE 10 MG/ML IJ SOLN
10.0000 mg | Freq: Once | INTRAMUSCULAR | Status: AC
  Administered 2022-08-29: 10 mg via INTRAMUSCULAR

## 2022-08-29 MED ORDER — SUMATRIPTAN SUCCINATE 6 MG/0.5ML ~~LOC~~ SOLN
6.0000 mg | Freq: Once | SUBCUTANEOUS | Status: AC
  Administered 2022-08-29: 6 mg via SUBCUTANEOUS

## 2022-08-29 MED ORDER — METOCLOPRAMIDE HCL 5 MG/ML IJ SOLN
INTRAMUSCULAR | Status: AC
  Filled 2022-08-29: qty 2

## 2022-08-29 MED ORDER — DEXAMETHASONE SODIUM PHOSPHATE 10 MG/ML IJ SOLN
INTRAMUSCULAR | Status: AC
  Filled 2022-08-29: qty 1

## 2022-08-29 MED ORDER — SUMATRIPTAN SUCCINATE 50 MG PO TABS: 50.0000 mg | ORAL_TABLET | Freq: Once | ORAL | 0 refills | Status: DC

## 2022-08-29 MED ORDER — SUMATRIPTAN SUCCINATE 6 MG/0.5ML ~~LOC~~ SOLN
SUBCUTANEOUS | Status: AC
  Filled 2022-08-29: qty 0.5

## 2022-08-29 MED ORDER — METOCLOPRAMIDE HCL 5 MG/ML IJ SOLN
5.0000 mg | Freq: Once | INTRAMUSCULAR | Status: AC
  Administered 2022-08-29: 5 mg via INTRAMUSCULAR

## 2022-08-29 NOTE — Discharge Instructions (Signed)
Please increase oral fluid intake Please take medications as prescribed With regards to the Imitrex, please take the first dose at the outset of the headache and you may repeat another dose in 2 hours if there is no improvement in her symptoms If your symptoms persist please return to the urgent care for further evaluation.

## 2022-08-29 NOTE — ED Triage Notes (Addendum)
PT has had a HA since Sat night with no relief. Pt took Ibuprofen with out any relief. Pt reports nausea and light sensitivity with this HA. Pt took her last imitrex yesterday and has been taking ibuprofen with no relief.

## 2022-08-31 NOTE — ED Provider Notes (Signed)
[facility]    CSN: [id number] Arrival date & time: 08/29/22  1921      History   Chief Complaint Chief Complaint  Patient presents with   Headache    HPI Caitlin Bailey is a 42 y.o. female with a history of migraines comes to the urgent care with 3-day history of right-sided retro-orbital headache.  The headache is of moderate severity throbbing and aggravated by light and noise.  Patient has taken Imitrex with no improvement in his symptoms.  Her last dose of dose of Imitrex was yesterday.  Headache is relieved by going to the dark/quiet place.  She has non colored floaters in the visual field.  No numbness or tingling.  No difficulty finding her words.  She has some nausea but no vomiting.  No fever, chills, runny nose, sore throat, sick contacts. HPI  Past Medical History:  Diagnosis Date   Cervical mass    On 08/02/17 she underwent an exam under anesthesia with cervical biopsy with Dr Nancy Marus which revealed benign fibrovascular stroma from the mass.   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  Dr Nancy Marus   CYST EXCISION Left 2000s   "groin/inner thigh"   LAPAROSCOPIC APPENDECTOMY N/A 03/15/2017    Procedure: APPENDECTOMY LAPAROSCOPIC;  Surgeon: Judeth Horn, MD;  Location: [facility];  Service: General;  Laterality: N/A;  and RIGHT OVARIAN CYSTECTOMY   OVARIAN CYST REMOVAL  2009   ex-lap    OB History     Gravida  2   Para  2   Term  2   Preterm  0   AB  0   Living  2      SAB  0   IAB  0   Ectopic  0   Multiple  0   Live Births  2            Home Medications    Prior to Admission medications   Medication Sig Start Date End Date Taking? Authorizing Provider  SUMAtriptan (IMITREX) 50 MG tablet Take 1 tablet (50 mg total) by mouth once for 1 dose. May repeat in 2 hours if headache persists or recurs.  Limit to 200 mg in 24 hours. 08/29/22 08/29/22 Yes Renna Kilmer, Myrene Galas, MD  ibuprofen (ADVIL) 200 MG tablet 2-4 pastillas cada 6 horas con comida a necesita para dolor 05/24/21   Mack Hook, MD  nortriptyline (PAMELOR) 10 MG capsule Take 1 capsule (10 mg total) by mouth at bedtime. 03/01/22   Mack Hook, MD    Family History Family History  Problem Relation Age of Onset   Heart disease Mother    Kidney disease Father        ultimately developed  multi organ failure   Diabetes Father    Diabetes Brother    Cancer Paternal Aunt     Social History Social History   Tobacco Use   Smoking status: Never   Smokeless tobacco: Never  Vaping Use   Vaping Use: Never used  Substance Use Topics   Alcohol use: No   Drug use: No     Allergies   Topamax [topiramate]   Review of Systems Review of Systems As per HPI  Physical Exam Triage Vital Signs ED Triage Vitals  Enc Vitals Group     BP 08/29/22 1953 125/79     Pulse Rate 08/29/22 1953 79     Resp 08/29/22 1953 18     Temp 08/29/22 1953 99.3 F (37.4 C)     Temp src --      SpO2 08/29/22 1953 99 %     Weight --      Height --      Head Circumference --      Peak Flow --      Pain Score 08/29/22 1948 10     Pain Loc --      Pain Edu? --      Excl. in [place]? --    No data  found.  Updated Vital Signs BP 125/79   Pulse 79   Temp 99.3 F (37.4 C)   Resp 18   LMP 07/31/2022   SpO2 99%   Visual Acuity Right Eye Distance:   Left Eye Distance:   Bilateral Distance:    Right Eye Near:   Left Eye Near:    Bilateral Near:     Physical Exam Vitals and nursing note reviewed.  Constitutional:      General: She is in acute distress.     Appearance: She is ill-appearing.  Cardiovascular:     Rate and Rhythm: Normal rate and regular rhythm.  Pulmonary:     Effort: Pulmonary effort is normal.     Breath sounds: Normal breath sounds.  Abdominal:     General: Bowel sounds are normal.     Palpations: Abdomen is soft.  Skin:    General: Skin is warm and dry.  Neurological:     Mental Status: She is alert and oriented to person, place, and time.     GCS: GCS eye subscore is 4. GCS verbal subscore is 5. GCS motor subscore is 6.  Psychiatric:        Mood and Affect: Mood normal.        Behavior: Behavior normal.      UC Treatments / Results  Labs (all labs ordered are listed, but only abnormal results are displayed) Labs Reviewed - No data to display  EKG   Radiology No results found.  Procedures Procedures (including critical care time)  Medications Ordered in UC Medications  metoCLOPramide (REGLAN) injection 5 mg (5 mg Intramuscular Given 08/29/22 2039)  dexamethasone (DECADRON) injection 10 mg (10 mg Intramuscular Given 08/29/22 2039)  SUMAtriptan (IMITREX) injection 6 mg (6 mg Subcutaneous Given 08/29/22 2037)    Initial Impression / Assessment and Plan / UC Course  I have reviewed the triage vital signs and the nursing notes.  Pertinent labs & imaging results that were available during my care of the patient were reviewed by me and considered in my medical decision making (see chart for details).    1.  Intractable migraine with aura: Dexamethasone 10 mg IM x 1 dose Reglan 5 mg IM x 1  dose Imitrex 6 mg subcut x 1 dose Maintain  adequate hydration Imitrex 50 mg prescribed at discharge-instructions given to patient Return precautions given. Final Clinical Impressions(s) / UC Diagnoses   Final diagnoses:  Intractable migraine with aura with status migrainosus     Discharge Instructions      Please increase oral fluid intake Please take medications as prescribed With regards to the Imitrex, please take the first dose at the outset of the headache and you may repeat another dose in 2 hours if there is no improvement in her symptoms If your symptoms persist please return to the urgent care for further evaluation.   ED Prescriptions     Medication Sig Dispense Auth. Provider   SUMAtriptan (IMITREX) 50 MG tablet Take 1 tablet (50 mg total) by mouth once for 1 dose. May repeat in 2 hours if headache persists or recurs.  Limit to 200 mg in 24 hours. 15 tablet Mollye Guinta, Myrene Galas, MD      PDMP not reviewed this encounter.   Chase Picket, MD 08/31/22 2248

## 2022-11-11 ENCOUNTER — Encounter (HOSPITAL_COMMUNITY): Payer: Self-pay

## 2022-11-11 ENCOUNTER — Ambulatory Visit (HOSPITAL_COMMUNITY)
Admission: EM | Admit: 2022-11-11 | Discharge: 2022-11-11 | Disposition: A | Discharge status: Home / Self Care | Payer: Self-pay | Attending: Emergency Medicine | Admitting: Emergency Medicine

## 2022-11-11 DIAGNOSIS — G43111 Migraine with aura, intractable, with status migrainosus: Secondary | ICD-10-CM

## 2022-11-11 MED ORDER — KETOROLAC TROMETHAMINE 60 MG/2ML IM SOLN
30.0000 mg | Freq: Once | INTRAMUSCULAR | Status: AC
  Administered 2022-11-11: 30 mg via INTRAMUSCULAR

## 2022-11-11 MED ORDER — SUMATRIPTAN SUCCINATE 50 MG PO TABS: 50.0000 mg | ORAL_TABLET | Freq: Once | ORAL | 0 refills | Status: DC

## 2022-11-11 MED ORDER — DIPHENHYDRAMINE HCL 25 MG PO CAPS
25.0000 mg | ORAL_CAPSULE | Freq: Once | ORAL | Status: AC
  Administered 2022-11-11: 25 mg via ORAL

## 2022-11-11 MED ORDER — DEXAMETHASONE SODIUM PHOSPHATE 10 MG/ML IJ SOLN
10.0000 mg | Freq: Once | INTRAMUSCULAR | Status: AC
  Administered 2022-11-11: 10 mg via INTRAMUSCULAR

## 2022-11-11 MED ORDER — DEXAMETHASONE SODIUM PHOSPHATE 10 MG/ML IJ SOLN
INTRAMUSCULAR | Status: AC
  Filled 2022-11-11: qty 1

## 2022-11-11 MED ORDER — KETOROLAC TROMETHAMINE 30 MG/ML IJ SOLN
INTRAMUSCULAR | Status: AC
  Filled 2022-11-11: qty 1

## 2022-11-11 MED ORDER — ONDANSETRON 4 MG PO TBDP
ORAL_TABLET | ORAL | Status: AC
  Filled 2022-11-11: qty 1

## 2022-11-11 MED ORDER — METOCLOPRAMIDE HCL 5 MG/ML IJ SOLN
INTRAMUSCULAR | Status: AC
  Filled 2022-11-11: qty 2

## 2022-11-11 MED ORDER — METOCLOPRAMIDE HCL 5 MG/ML IJ SOLN: 10.0000 mg | Freq: Once | INTRAMUSCULAR | Status: DC

## 2022-11-11 MED ORDER — METOCLOPRAMIDE HCL 5 MG/ML IJ SOLN
10.0000 mg | Freq: Once | INTRAMUSCULAR | Status: AC
  Administered 2022-11-11: 10 mg via INTRAMUSCULAR

## 2022-11-11 MED ORDER — DIPHENHYDRAMINE HCL 25 MG PO CAPS
ORAL_CAPSULE | ORAL | Status: AC
  Filled 2022-11-11: qty 1

## 2022-11-11 NOTE — ED Triage Notes (Signed)
Per Interpreter Atzin #[id number] Patient c/o headache x 2 days. Patient reports a history of migraines. Patient also c/o nausea and light sensitivity.  Patient states she was prescribed Sumatriptan, but is not working. Patient last took her last pill last night

## 2022-11-11 NOTE — Discharge Instructions (Addendum)
Hoy le hemos dado un cctel para la migraa en la clnica. Las cpsulas de Benadryl pueden provocarle sueo. Regrese a la clnica o busque atencin inmediata si esto no resuelve su migraa. Por favor, impulse los lquidos orales con al menos 64 onzas de agua al [employer]. Evite los desencadenantes conocidos; puede resultar til llevar un "diario de migraas" para ayudar a identificar sus desencadenantes. He vuelto a surtir Interior and spatial designer, haga un seguimiento con su proveedor de atencin primaria dentro de la prxima semana para una evaluacin ms detallada de sus migraas.

## 2022-11-11 NOTE — ED Provider Notes (Signed)
[facility]    CSN: [id number] Arrival date & time: 11/11/22  0801      History   Chief Complaint Chief Complaint  Patient presents with   Migraine    HPI Caitlin Bailey is a 42 y.o. female.   Patient reports a right-sided migraine for the past 2 days.  She took her last sumatriptan that she had last night, but her migraine still persisted.  She reports photosensitivity and nausea with some emesis.  She is unsure what triggered this migraine.  She does have a history of migraines.  She denies blurred vision, fevers, neck pain or recent illness.    The history is provided by the patient and medical records. The history is limited by a language barrier. A language interpreter was used.  Migraine Associated symptoms include headaches.    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  Dr Cleda Mccreedy   CYST EXCISION Left 2000s   "groin/inner thigh"   LAPAROSCOPIC APPENDECTOMY N/A 03/15/2017    Procedure: APPENDECTOMY LAPAROSCOPIC;  Surgeon: Jimmye Norman, MD;  Location: [facility];  Service: General;  Laterality: N/A;  and RIGHT OVARIAN CYSTECTOMY   OVARIAN CYST REMOVAL  2009   ex-lap    OB History     Gravida  2   Para  2   Term  2   Preterm  0   AB  0   Living  2      SAB  0   IAB  0   Ectopic  0   Multiple  0   Live Births  2            Home Medications    Prior to Admission medications   Medication Sig Start Date End Date Taking? Authorizing Provider  ibuprofen (ADVIL) 200 MG tablet 2-4 pastillas cada 6 horas con comida a necesita para dolor 05/24/21   Julieanne Manson, MD  nortriptyline (PAMELOR) 10 MG capsule Take 1 capsule (10 mg total) by mouth at bedtime. 03/01/22   Julieanne Manson, MD  SUMAtriptan (IMITREX) 50 MG tablet Take 1 tablet (50 mg total) by mouth once for 1 dose. May repeat in 2 hours if headache persists or recurs.  Limit to 200 mg in 24 hours. 11/11/22 11/11/22  Jettie Mannor, [place], FNP    Family History Family History  Problem Relation Age of Onset   Heart disease Mother    Kidney disease Father        ultimately developed multi organ failure  Diabetes Father    Diabetes Brother    Cancer Paternal Aunt     Social History Social History   Tobacco Use   Smoking status: Never   Smokeless tobacco: Never  Vaping Use   Vaping Use: Never used  Substance Use Topics   Alcohol use: No   Drug use: No     Allergies   Topamax [topiramate]   Review of Systems Review of Systems  Constitutional:  Negative for chills, fatigue and fever.  Eyes:  Positive for photophobia. Negative for pain.  Respiratory:  Negative for cough.   Gastrointestinal:  Positive for nausea and vomiting.  Musculoskeletal:  Negative for neck pain and neck stiffness.  Neurological:  Positive for headaches. Negative for numbness.     Physical Exam Triage Vital Signs ED Triage Vitals  Enc Vitals Group     BP 11/11/22 0820 (!) 155/94     Pulse  Rate 11/11/22 0820 76     Resp 11/11/22 0820 14     Temp 11/11/22 0820 98.2 F (36.8 C)     Temp Source 11/11/22 0820 Oral     SpO2 11/11/22 0820 98 %     Weight --      Height --      Head Circumference --      Peak Flow --      Pain Score 11/11/22 0823 10     Pain Loc --      Pain Edu? --      Excl. in GC? --    No data found.  Updated Vital Signs BP (!) 155/94 (BP Location: Left Arm)   Pulse 76   Temp 98.2 F (36.8 C) (Oral)   Resp 14   LMP 11/11/2022   SpO2 98%   Visual Acuity Right Eye Distance:   Left Eye Distance:   Bilateral Distance:    Right Eye Near:   Left Eye Near:    Bilateral Near:     Physical Exam Vitals and nursing note reviewed.  Constitutional:      Appearance: Normal appearance.  HENT:     Head: Normocephalic and atraumatic.     Right Ear: External ear normal.     Left Ear: External ear normal.     Nose: Nose normal.     Mouth/Throat:     Mouth: Mucous membranes are moist.  Eyes:     Extraocular Movements: Extraocular movements intact.     Conjunctiva/sclera: Conjunctivae normal.     Pupils: Pupils are equal, round, and reactive to light.  Cardiovascular:     Rate and Rhythm: Normal rate and regular rhythm.     Heart sounds: Normal heart sounds, S1 normal and S2 normal. No murmur heard. Pulmonary:     Effort: Pulmonary effort is normal. No respiratory distress.     Breath sounds: Normal breath sounds.     Comments: Lungs vesicular posteriorly. Musculoskeletal:        General: No swelling. Normal range of motion.     Cervical back: Normal range of motion.  Skin:    General: Skin is warm and dry.     Capillary Refill: Capillary refill takes less than 2 seconds.  Neurological:     General: No focal deficit present.     Mental Status: She is alert and oriented to person, place, and time.  Psychiatric:        Mood and Affect: Mood normal.        Behavior: Behavior normal. Behavior is cooperative.  UC Treatments / Results   Labs (all labs ordered are listed, but only abnormal results are displayed) Labs Reviewed - No data to display  EKG   Radiology No results found.  Procedures Procedures (including critical care time)  Medications Ordered in UC Medications  diphenhydrAMINE (BENADRYL) capsule 25 mg (25 mg Oral Given 11/11/22 0854)  ketorolac (TORADOL) injection 30 mg (30 mg Intramuscular Given 11/11/22 0855)  dexamethasone (DECADRON) injection 10 mg (10 mg Intramuscular Given 11/11/22 0854)  metoCLOPramide (REGLAN) injection 10 mg (10 mg Intramuscular Given 11/11/22 0858)    Initial Impression / Assessment and Plan / UC Course  I have reviewed the triage vital signs and the nursing notes.  Pertinent labs & imaging results that were available during my care of the patient were reviewed by me and considered in my medical decision making (see chart for details).  Vitals and triage reviewed, patient is hemodynamically stable.  History of migraines, migraine onset x 2 days with photophobia, nausea and emesis.  Presentation is typical of her migraine, without fever, neck stiffness, blurred vision, or red flag symptoms. PERRLA, EOMI, CN 2-12 grossly intact.  Out of sumatriptan.  Will refill.  Given migraine cocktail in clinic, given oral Benadryl, patient does have to drive home.  Given IM Toradol, patient is currently on her menses.  Advised to follow-up with clinic or to the emergency department if cocktail does not relieve migraine.  Encouraged to push oral fluids.  Advised to follow-up with primary care for further migraine control within the next week.  Patient verbalized understanding, no questions at this time.     Final Clinical Impressions(s) / UC Diagnoses   Final diagnoses:  Intractable migraine with aura with status migrainosus     Discharge Instructions      Hoy le hemos dado un cctel para la migraa en la clnica. Las cpsulas de Benadryl pueden provocarle sueo. Regrese a la clnica o  busque atencin inmediata si esto no resuelve su migraa. Por favor, impulse los lquidos orales con al menos 64 onzas de agua al [employer]. Evite los desencadenantes conocidos; puede resultar til llevar un "diario de migraas" para ayudar a identificar sus desencadenantes. He vuelto a surtir Interior and spatial designer, haga un seguimiento con su proveedor de atencin primaria dentro de la prxima semana para una evaluacin ms detallada de sus migraas.     ED Prescriptions     Medication Sig Dispense Auth. Provider   SUMAtriptan (IMITREX) 50 MG tablet Take 1 tablet (50 mg total) by mouth once for 1 dose. May repeat in 2 hours if headache persists or recurs.  Limit to 200 mg in 24 hours. 15 tablet Osa Fogarty, [place], [place]      PDMP not reviewed this encounter.   Rinaldo Ratel [place], [place] 11/11/22 [phone]

## 2023-05-12 ENCOUNTER — Ambulatory Visit (HOSPITAL_COMMUNITY)
Admission: EM | Admit: 2023-05-12 | Discharge: 2023-05-12 | Disposition: A | Discharge status: Home / Self Care | Payer: Self-pay | Attending: Family Medicine | Admitting: Family Medicine

## 2023-05-12 ENCOUNTER — Encounter (HOSPITAL_COMMUNITY): Payer: Self-pay

## 2023-05-12 DIAGNOSIS — G43C1 Periodic headache syndromes in child or adult, intractable: Secondary | ICD-10-CM

## 2023-05-12 MED ORDER — SUMATRIPTAN SUCCINATE 50 MG PO TABS: 50.0000 mg | ORAL_TABLET | ORAL | 0 refills | Status: AC | PRN

## 2023-05-12 MED ORDER — METOCLOPRAMIDE HCL 5 MG/ML IJ SOLN
INTRAMUSCULAR | Status: AC
  Filled 2023-05-12: qty 2

## 2023-05-12 MED ORDER — METOCLOPRAMIDE HCL 5 MG/ML IJ SOLN
5.0000 mg | Freq: Once | INTRAMUSCULAR | Status: AC
  Administered 2023-05-12: 5 mg via INTRAMUSCULAR

## 2023-05-12 MED ORDER — DEXAMETHASONE SODIUM PHOSPHATE 10 MG/ML IJ SOLN
INTRAMUSCULAR | Status: AC
  Filled 2023-05-12: qty 1

## 2023-05-12 MED ORDER — SUMATRIPTAN SUCCINATE 6 MG/0.5ML ~~LOC~~ SOLN
6.0000 mg | Freq: Once | SUBCUTANEOUS | Status: AC
  Administered 2023-05-12: 6 mg via SUBCUTANEOUS

## 2023-05-12 MED ORDER — DEXAMETHASONE SODIUM PHOSPHATE 10 MG/ML IJ SOLN
10.0000 mg | Freq: Once | INTRAMUSCULAR | Status: AC
  Administered 2023-05-12: 10 mg via INTRAMUSCULAR

## 2023-05-12 MED ORDER — SUMATRIPTAN SUCCINATE 6 MG/0.5ML ~~LOC~~ SOLN
SUBCUTANEOUS | Status: AC
  Filled 2023-05-12: qty 0.5

## 2023-05-12 MED ORDER — KETOROLAC TROMETHAMINE 30 MG/ML IJ SOLN
30.0000 mg | Freq: Once | INTRAMUSCULAR | Status: AC
  Administered 2023-05-12: 30 mg via INTRAMUSCULAR

## 2023-05-12 MED ORDER — AMOXICILLIN-POT CLAVULANATE 875-125 MG PO TABS: 1.0000 | ORAL_TABLET | Freq: Two times a day (BID) | ORAL | 0 refills | Status: AC

## 2023-05-12 MED ORDER — KETOROLAC TROMETHAMINE 30 MG/ML IJ SOLN
INTRAMUSCULAR | Status: AC
  Filled 2023-05-12: qty 1

## 2023-05-12 NOTE — Discharge Instructions (Signed)
You have been given a shot of Toradol 30 mg, sumatriptan 6 mg, dexamethasone 10 mg, and metocloptramide 5 mg today.  Sumatriptan 50 mg--take 1 tablet as needed for migraine headache.  If in 2 hours, the headache persist, you may repeat the dose once more.  Do not take more than 4 tablets in 24 hours.  Take amoxicillin-clavulanate 875 mg--1 tab twice daily with food for 7 days  Please followup with primary care

## 2023-05-12 NOTE — ED Provider Notes (Signed)
[facility]    CSN: [id number] Arrival date & time: 05/12/23  0805      History   Chief Complaint Chief Complaint  Patient presents with   Migraine    HPI Caitlin Bailey is a 42 y.o. female.    Migraine   Here for headache that began on October 23.  It is mainly on the right frontal area and is throbbing and severe.  Ibuprofen has not helped.  She is nauseated and is having some photophobia.  She does have a history of migraines and has been here 2 other times and had relief with her headache injection cocktail.  Sumatriptan 50 mg at home does help often for her migraines, but she has been out  She also has some pain along her right lower jaw and feels swollen there.   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  Dr Cleda Mccreedy   CYST EXCISION Left 2000s   "groin/inner thigh"   LAPAROSCOPIC APPENDECTOMY N/A 03/15/2017   Procedure: APPENDECTOMY  LAPAROSCOPIC;  Surgeon: Jimmye Norman, MD;  Location: [facility];  Service: General;  Laterality: N/A;  and RIGHT OVARIAN CYSTECTOMY   OVARIAN CYST REMOVAL  2009   ex-lap    OB History     Gravida  2   Para  2   Term  2   Preterm  0   AB  0   Living  2      SAB  0   IAB  0   Ectopic  0   Multiple  0   Live Births  2            Home Medications    Prior to Admission medications   Medication Sig Start Date End Date Taking? Authorizing Provider  amoxicillin-clavulanate (AUGMENTIN) 875-125 MG tablet Take 1 tablet by mouth 2 (two) times daily for 7 days. 05/12/23 05/19/23 Yes Zenia Resides, MD  ibuprofen (ADVIL) 200 MG tablet 2-4 pastillas cada 6 horas con comida a necesita para dolor 05/24/21   Julieanne Manson, MD  SUMAtriptan (IMITREX) 50 MG tablet Take 1 tablet (50 mg total) by mouth every 2 (two) hours as needed for migraine (NTE 200 mg or 4 tabs in 24 hours). May repeat in 2 hours if headache persists or recurs.  Limit to 200 mg in 24 hours. 05/12/23   Zenia Resides, MD    Family History Family History  Problem Relation Age of Onset  Heart disease Mother    Kidney disease Father        ultimately developed multi organ failure   Diabetes Father    Diabetes Brother    Cancer Paternal Aunt     Social History Social History   Tobacco Use   Smoking status: Never   Smokeless tobacco: Never  Vaping Use   Vaping status: Never Used  Substance Use Topics   Alcohol use: No   Drug use: No     Allergies   Topamax [topiramate]   Review of Systems Review of Systems   Physical Exam Triage Vital Signs ED Triage Vitals  Encounter Vitals Group     BP 05/12/23 0837 114/79     Systolic BP Percentile --      Diastolic BP Percentile --      Pulse Rate 05/12/23 0837 72     Resp 05/12/23 0837 16     Temp 05/12/23 0837 98.5 F (36.9 C)     Temp Source 05/12/23 0837 Oral     SpO2 05/12/23 0837 98 %     Weight --      Height --      Head  Circumference --      Peak Flow --      Pain Score 05/12/23 0838 10     Pain Loc --      Pain Education --      Exclude from Growth Chart --    No data found.  Updated Vital Signs BP 114/79 (BP Location: Left Arm)   Pulse 72   Temp 98.5 F (36.9 C) (Oral)   Resp 16   LMP 05/09/2023 (Approximate)   SpO2 98%   Visual Acuity Right Eye Distance:   Left Eye Distance:   Bilateral Distance:    Right Eye Near:   Left Eye Near:    Bilateral Near:     Physical Exam Vitals reviewed.  Constitutional:      General: She is not in acute distress.    Appearance: She is not ill-appearing, toxic-appearing or diaphoretic.  HENT:     Right Ear: Ear canal normal.     Nose: Nose normal.     Mouth/Throat:     Mouth: Mucous membranes are moist.     Pharynx: No oropharyngeal exudate or posterior oropharyngeal erythema.     Comments: There is some faint swelling around the right jawline.  No fluctuance.  No erythema.  There is no obvious swelling in the oral cavity. Eyes:     Extraocular Movements: Extraocular movements intact.     Conjunctiva/sclera: Conjunctivae normal.     Pupils: Pupils are equal, round, and reactive to light.  Cardiovascular:     Rate and Rhythm: Normal rate and regular rhythm.     Heart sounds: No murmur heard. Pulmonary:     Effort: Pulmonary effort is normal. No respiratory distress.     Breath sounds: No stridor. No wheezing, rhonchi or rales.  Musculoskeletal:     Cervical back: Neck supple.  Lymphadenopathy:     Cervical: No cervical adenopathy.  Skin:    Capillary Refill: Capillary refill takes less than 2 seconds.     Coloration: Skin is not jaundiced or pale.  Neurological:     General: No focal deficit present.     Mental Status: She is alert and oriented to person, place, and time.     Cranial Nerves: No cranial nerve deficit.     Motor: No weakness.  Psychiatric:  Behavior: Behavior normal.      UC Treatments / Results  Labs (all labs  ordered are listed, but only abnormal results are displayed) Labs Reviewed - No data to display  EKG   Radiology No results found.  Procedures Procedures (including critical care time)  Medications Ordered in UC Medications  ketorolac (TORADOL) 30 MG/ML injection 30 mg (30 mg Intramuscular Given 05/12/23 0907)  metoCLOPramide (REGLAN) injection 5 mg (5 mg Intramuscular Given 05/12/23 0908)  dexamethasone (DECADRON) injection 10 mg (10 mg Intramuscular Given 05/12/23 0907)  SUMAtriptan (IMITREX) injection 6 mg (6 mg Subcutaneous Given 05/12/23 0905)    Initial Impression / Assessment and Plan / UC Course  I have reviewed the triage vital signs and the nursing notes.  Pertinent labs & imaging results that were available during my care of the patient were reviewed by me and considered in my medical decision making (see chart for details).    Toradol, Reglan, Decadron, and Imitrex injections are given here.  Imitrex 50 mg tablets are sent into the pharmacy so she has something to use for home relief in the future.  I did ask her to follow-up with her primary care  Augmentin is sent in for possible dental infection. Final Clinical Impressions(s) / UC Diagnoses   Final diagnoses:  Intractable periodic headache syndrome     Discharge Instructions      You have been given a shot of Toradol 30 mg, sumatriptan 6 mg, dexamethasone 10 mg, and metocloptramide 5 mg today.  Sumatriptan 50 mg--take 1 tablet as needed for migraine headache.  If in 2 hours, the headache persist, you may repeat the dose once more.  Do not take more than 4 tablets in 24 hours.  Take amoxicillin-clavulanate 875 mg--1 tab twice daily with food for 7 days  Please followup with primary care    ED Prescriptions     Medication Sig Dispense Auth. Provider   SUMAtriptan (IMITREX) 50 MG tablet Take 1 tablet (50 mg total) by mouth every 2 (two) hours as needed for migraine (NTE 200 mg or 4 tabs in 24 hours).  May repeat in 2 hours if headache persists or recurs.  Limit to 200 mg in 24 hours. 10 tablet Zenia Resides, MD   amoxicillin-clavulanate (AUGMENTIN) 875-125 MG tablet Take 1 tablet by mouth 2 (two) times daily for 7 days. 14 tablet Jazzmyn Filion, Janace Aris, MD      PDMP not reviewed this encounter.   Zenia Resides, MD 05/12/23 [phone]

## 2023-05-12 NOTE — ED Triage Notes (Addendum)
Pt presents with migraine since Wed. 10/23, began at 1000. Pt reports she has been taking Ibuprofen every 3 hours, last dose was last night. Pt also having sensitivity to light, pt wearing sunglasses in triage. Pt did begin period on Tuesday 10/22.
